# Patient Record
Sex: Female | Born: 1985 | Hispanic: No | Marital: Single | Smoking: Never smoker
Health system: Southern US, Community
[De-identification: ages and names within clinical notes are randomized; demographics above are authoritative.]

## PROBLEM LIST (undated history)

## (undated) DIAGNOSIS — I8290 Acute embolism and thrombosis of unspecified vein: Secondary | ICD-10-CM

## (undated) DIAGNOSIS — F419 Anxiety disorder, unspecified: Secondary | ICD-10-CM

---

## 2019-06-02 ENCOUNTER — Other Ambulatory Visit: Payer: Self-pay | Admitting: *Deleted

## 2019-06-02 DIAGNOSIS — Z20822 Contact with and (suspected) exposure to covid-19: Secondary | ICD-10-CM

## 2019-06-04 LAB — NOVEL CORONAVIRUS, NAA: SARS-CoV-2, NAA: NOT DETECTED

## 2020-01-29 DIAGNOSIS — R002 Palpitations: Secondary | ICD-10-CM | POA: Diagnosis not present

## 2020-01-29 DIAGNOSIS — F418 Other specified anxiety disorders: Secondary | ICD-10-CM | POA: Diagnosis not present

## 2020-02-01 DIAGNOSIS — M9903 Segmental and somatic dysfunction of lumbar region: Secondary | ICD-10-CM | POA: Diagnosis not present

## 2020-02-01 DIAGNOSIS — M5136 Other intervertebral disc degeneration, lumbar region: Secondary | ICD-10-CM | POA: Diagnosis not present

## 2020-02-01 DIAGNOSIS — M5432 Sciatica, left side: Secondary | ICD-10-CM | POA: Diagnosis not present

## 2020-02-05 DIAGNOSIS — M5432 Sciatica, left side: Secondary | ICD-10-CM | POA: Diagnosis not present

## 2020-02-05 DIAGNOSIS — M9903 Segmental and somatic dysfunction of lumbar region: Secondary | ICD-10-CM | POA: Diagnosis not present

## 2020-02-05 DIAGNOSIS — M5136 Other intervertebral disc degeneration, lumbar region: Secondary | ICD-10-CM | POA: Diagnosis not present

## 2020-02-07 DIAGNOSIS — M9903 Segmental and somatic dysfunction of lumbar region: Secondary | ICD-10-CM | POA: Diagnosis not present

## 2020-02-07 DIAGNOSIS — M5136 Other intervertebral disc degeneration, lumbar region: Secondary | ICD-10-CM | POA: Diagnosis not present

## 2020-02-07 DIAGNOSIS — M5432 Sciatica, left side: Secondary | ICD-10-CM | POA: Diagnosis not present

## 2020-02-12 DIAGNOSIS — M5432 Sciatica, left side: Secondary | ICD-10-CM | POA: Diagnosis not present

## 2020-02-12 DIAGNOSIS — M5136 Other intervertebral disc degeneration, lumbar region: Secondary | ICD-10-CM | POA: Diagnosis not present

## 2020-02-12 DIAGNOSIS — M9903 Segmental and somatic dysfunction of lumbar region: Secondary | ICD-10-CM | POA: Diagnosis not present

## 2020-02-14 DIAGNOSIS — M5432 Sciatica, left side: Secondary | ICD-10-CM | POA: Diagnosis not present

## 2020-02-14 DIAGNOSIS — M9903 Segmental and somatic dysfunction of lumbar region: Secondary | ICD-10-CM | POA: Diagnosis not present

## 2020-02-14 DIAGNOSIS — M5136 Other intervertebral disc degeneration, lumbar region: Secondary | ICD-10-CM | POA: Diagnosis not present

## 2020-02-20 DIAGNOSIS — M5432 Sciatica, left side: Secondary | ICD-10-CM | POA: Diagnosis not present

## 2020-02-20 DIAGNOSIS — M9903 Segmental and somatic dysfunction of lumbar region: Secondary | ICD-10-CM | POA: Diagnosis not present

## 2020-02-20 DIAGNOSIS — M5136 Other intervertebral disc degeneration, lumbar region: Secondary | ICD-10-CM | POA: Diagnosis not present

## 2020-02-22 DIAGNOSIS — M5136 Other intervertebral disc degeneration, lumbar region: Secondary | ICD-10-CM | POA: Diagnosis not present

## 2020-02-22 DIAGNOSIS — F418 Other specified anxiety disorders: Secondary | ICD-10-CM | POA: Diagnosis not present

## 2020-02-22 DIAGNOSIS — Z13220 Encounter for screening for lipoid disorders: Secondary | ICD-10-CM | POA: Diagnosis not present

## 2020-02-22 DIAGNOSIS — R002 Palpitations: Secondary | ICD-10-CM | POA: Diagnosis not present

## 2020-02-22 DIAGNOSIS — M9903 Segmental and somatic dysfunction of lumbar region: Secondary | ICD-10-CM | POA: Diagnosis not present

## 2020-02-22 DIAGNOSIS — M5432 Sciatica, left side: Secondary | ICD-10-CM | POA: Diagnosis not present

## 2020-02-22 DIAGNOSIS — Z0001 Encounter for general adult medical examination with abnormal findings: Secondary | ICD-10-CM | POA: Diagnosis not present

## 2020-02-26 DIAGNOSIS — M5136 Other intervertebral disc degeneration, lumbar region: Secondary | ICD-10-CM | POA: Diagnosis not present

## 2020-02-26 DIAGNOSIS — M5432 Sciatica, left side: Secondary | ICD-10-CM | POA: Diagnosis not present

## 2020-02-26 DIAGNOSIS — M9903 Segmental and somatic dysfunction of lumbar region: Secondary | ICD-10-CM | POA: Diagnosis not present

## 2020-02-28 DIAGNOSIS — M5136 Other intervertebral disc degeneration, lumbar region: Secondary | ICD-10-CM | POA: Diagnosis not present

## 2020-02-28 DIAGNOSIS — M9903 Segmental and somatic dysfunction of lumbar region: Secondary | ICD-10-CM | POA: Diagnosis not present

## 2020-02-28 DIAGNOSIS — M5432 Sciatica, left side: Secondary | ICD-10-CM | POA: Diagnosis not present

## 2020-03-04 DIAGNOSIS — M5432 Sciatica, left side: Secondary | ICD-10-CM | POA: Diagnosis not present

## 2020-03-04 DIAGNOSIS — M9903 Segmental and somatic dysfunction of lumbar region: Secondary | ICD-10-CM | POA: Diagnosis not present

## 2020-03-04 DIAGNOSIS — M5136 Other intervertebral disc degeneration, lumbar region: Secondary | ICD-10-CM | POA: Diagnosis not present

## 2020-03-06 DIAGNOSIS — M5136 Other intervertebral disc degeneration, lumbar region: Secondary | ICD-10-CM | POA: Diagnosis not present

## 2020-03-06 DIAGNOSIS — M5432 Sciatica, left side: Secondary | ICD-10-CM | POA: Diagnosis not present

## 2020-03-06 DIAGNOSIS — M9903 Segmental and somatic dysfunction of lumbar region: Secondary | ICD-10-CM | POA: Diagnosis not present

## 2020-03-07 DIAGNOSIS — Z3202 Encounter for pregnancy test, result negative: Secondary | ICD-10-CM | POA: Diagnosis not present

## 2020-03-07 DIAGNOSIS — Z30011 Encounter for initial prescription of contraceptive pills: Secondary | ICD-10-CM | POA: Diagnosis not present

## 2020-03-13 DIAGNOSIS — M9903 Segmental and somatic dysfunction of lumbar region: Secondary | ICD-10-CM | POA: Diagnosis not present

## 2020-03-13 DIAGNOSIS — M5432 Sciatica, left side: Secondary | ICD-10-CM | POA: Diagnosis not present

## 2020-03-13 DIAGNOSIS — M5136 Other intervertebral disc degeneration, lumbar region: Secondary | ICD-10-CM | POA: Diagnosis not present

## 2020-03-18 DIAGNOSIS — M5432 Sciatica, left side: Secondary | ICD-10-CM | POA: Diagnosis not present

## 2020-03-18 DIAGNOSIS — M9903 Segmental and somatic dysfunction of lumbar region: Secondary | ICD-10-CM | POA: Diagnosis not present

## 2020-03-18 DIAGNOSIS — M5136 Other intervertebral disc degeneration, lumbar region: Secondary | ICD-10-CM | POA: Diagnosis not present

## 2020-03-28 DIAGNOSIS — M5136 Other intervertebral disc degeneration, lumbar region: Secondary | ICD-10-CM | POA: Diagnosis not present

## 2020-03-28 DIAGNOSIS — M5432 Sciatica, left side: Secondary | ICD-10-CM | POA: Diagnosis not present

## 2020-03-28 DIAGNOSIS — M9903 Segmental and somatic dysfunction of lumbar region: Secondary | ICD-10-CM | POA: Diagnosis not present

## 2020-04-10 DIAGNOSIS — J029 Acute pharyngitis, unspecified: Secondary | ICD-10-CM | POA: Diagnosis not present

## 2020-04-17 DIAGNOSIS — J029 Acute pharyngitis, unspecified: Secondary | ICD-10-CM | POA: Diagnosis not present

## 2020-04-17 DIAGNOSIS — M5432 Sciatica, left side: Secondary | ICD-10-CM | POA: Diagnosis not present

## 2020-04-17 DIAGNOSIS — M9903 Segmental and somatic dysfunction of lumbar region: Secondary | ICD-10-CM | POA: Diagnosis not present

## 2020-04-17 DIAGNOSIS — M5136 Other intervertebral disc degeneration, lumbar region: Secondary | ICD-10-CM | POA: Diagnosis not present

## 2020-04-24 DIAGNOSIS — R634 Abnormal weight loss: Secondary | ICD-10-CM | POA: Diagnosis not present

## 2020-04-24 DIAGNOSIS — R109 Unspecified abdominal pain: Secondary | ICD-10-CM | POA: Diagnosis not present

## 2020-04-24 DIAGNOSIS — K219 Gastro-esophageal reflux disease without esophagitis: Secondary | ICD-10-CM | POA: Diagnosis not present

## 2020-04-24 DIAGNOSIS — R1111 Vomiting without nausea: Secondary | ICD-10-CM | POA: Diagnosis not present

## 2020-05-08 DIAGNOSIS — K219 Gastro-esophageal reflux disease without esophagitis: Secondary | ICD-10-CM | POA: Diagnosis not present

## 2020-05-08 DIAGNOSIS — F418 Other specified anxiety disorders: Secondary | ICD-10-CM | POA: Diagnosis not present

## 2020-06-19 DIAGNOSIS — M9903 Segmental and somatic dysfunction of lumbar region: Secondary | ICD-10-CM | POA: Diagnosis not present

## 2020-06-19 DIAGNOSIS — M5136 Other intervertebral disc degeneration, lumbar region: Secondary | ICD-10-CM | POA: Diagnosis not present

## 2020-06-19 DIAGNOSIS — M5432 Sciatica, left side: Secondary | ICD-10-CM | POA: Diagnosis not present

## 2020-06-26 DIAGNOSIS — M79672 Pain in left foot: Secondary | ICD-10-CM | POA: Diagnosis not present

## 2020-07-03 DIAGNOSIS — M79675 Pain in left toe(s): Secondary | ICD-10-CM | POA: Diagnosis not present

## 2020-07-11 DIAGNOSIS — M5432 Sciatica, left side: Secondary | ICD-10-CM | POA: Diagnosis not present

## 2020-07-11 DIAGNOSIS — M9903 Segmental and somatic dysfunction of lumbar region: Secondary | ICD-10-CM | POA: Diagnosis not present

## 2020-07-11 DIAGNOSIS — M5136 Other intervertebral disc degeneration, lumbar region: Secondary | ICD-10-CM | POA: Diagnosis not present

## 2020-07-19 ENCOUNTER — Other Ambulatory Visit: Payer: Self-pay | Admitting: Podiatry

## 2020-07-19 ENCOUNTER — Other Ambulatory Visit: Payer: Self-pay

## 2020-07-19 ENCOUNTER — Ambulatory Visit (INDEPENDENT_AMBULATORY_CARE_PROVIDER_SITE_OTHER): Payer: BLUE CROSS/BLUE SHIELD | Admitting: Podiatry

## 2020-07-19 ENCOUNTER — Encounter: Payer: Self-pay | Admitting: Podiatry

## 2020-07-19 DIAGNOSIS — Z79899 Other long term (current) drug therapy: Secondary | ICD-10-CM | POA: Diagnosis not present

## 2020-07-19 NOTE — Progress Notes (Signed)
  Subjective:  Patient ID: Leanord Hawking, female    DOB: 11-27-1985,  MRN: [id number]  Chief Complaint  Patient presents with  . Nail Problem    left hallux nail. PT stated that it is coming off     34 y.o. female presents with the above complaint.  Patient presents with complaint of left hallux onychomycosis.  Patient states is been coming a little loose because of fungus underneath it.  She has not tried any medication she has not seen anyone else prior to seeing me.  She would like to discuss treatment options for this.  She denies any other acute complaint she has not had any trauma to the area.  She would like to discuss other treatment options that are available to her.   Review of Systems: Negative except as noted in the HPI. Denies N/V/F/Ch.  No past medical history on file.  Current Outpatient Medications:  .  PORTIA-28 0.15-30 MG-MCG tablet, Take by mouth., Disp: , Rfl:  .  sertraline (ZOLOFT) 50 MG tablet, Take 75 mg by mouth daily., Disp: , Rfl:   Social History   Tobacco Use  Smoking Status Not on file    Not on File Objective:  There were no vitals filed for this visit. There is no height or weight on file to calculate BMI. Constitutional Well developed. Well nourished.  Vascular Dorsalis pedis pulses palpable bilaterally. Posterior tibial pulses palpable bilaterally. Capillary refill normal to all digits.  No cyanosis or clubbing noted. Pedal hair growth normal.  Neurologic Normal speech. Oriented to person, place, and time. Epicritic sensation to light touch grossly present bilaterally.  Dermatologic  thickened discolored dystrophic nail noted to the left hallux.  Mild pain on palpation.  Orthopedic: Normal joint ROM without pain or crepitus bilaterally. No visible deformities. No bony tenderness.   Radiographs: None Assessment:   1. Long-term use of high-risk medication    Plan:  Patient was evaluated and treated and all questions  answered.  Left hallux onychomycosis -Educated the patient on the etiology of onychomycosis and various treatment options associated with improving the fungal load.  I explained to the patient that there is 3 treatment options available to treat the onychomycosis including topical, p.o., laser treatment.  Patient elected to undergo p.o. options with Lamisil/terbinafine therapy.  In order for me to start the medication therapy, I explained to the patient the importance of evaluating the liver and obtaining the liver function test.  Once the liver function test comes back normal I will start him on 76-month course of Lamisil therapy.  Patient understood all risk and would like to proceed with Lamisil therapy.  I have asked the patient to immediately stop the Lamisil therapy if she has any reactions to it and call the office or go to the emergency room right away.  Patient states understanding   No follow-ups on file.

## 2020-07-20 LAB — HEPATIC FUNCTION PANEL
ALT: 10 IU/L (ref 0–32)
AST: 11 IU/L (ref 0–40)
Albumin: 3.8 g/dL (ref 3.8–4.8)
Alkaline Phosphatase: 49 IU/L (ref 44–121)
Bilirubin Total: <0.2 mg/dL (ref 0.0–1.2)
Bilirubin, Direct: <0.1 mg/dL (ref 0.00–0.40)
Total Protein: 6.2 g/dL (ref 6.0–8.5)

## 2020-07-26 ENCOUNTER — Telehealth: Payer: Self-pay | Admitting: Podiatry

## 2020-07-26 NOTE — Telephone Encounter (Signed)
Pt was calling about lab results and statrting new fungus medication. Please call patient

## 2020-07-26 NOTE — Telephone Encounter (Signed)
There is no pharmacy listed. Would you be able to call them and add it. I can send it over then. That's why I couldn't send it last week.  Thank you

## 2020-07-26 NOTE — Telephone Encounter (Signed)
Pharmacy added.

## 2020-07-29 ENCOUNTER — Telehealth: Payer: Self-pay | Admitting: Podiatry

## 2020-07-29 MED ORDER — TERBINAFINE HCL 250 MG PO TABS: 250.0000 mg | ORAL_TABLET | Freq: Every day | ORAL | 0 refills | Status: DC

## 2020-07-29 NOTE — Telephone Encounter (Signed)
Patient called in stating the prescription for nail fungus wasn't called into pharmacy, pharmacy info has been updated, please advise

## 2020-07-29 NOTE — Telephone Encounter (Signed)
done

## 2020-07-29 NOTE — Addendum Note (Signed)
Addended by: Nicholes Rough on: 07/29/2020 09:58 AM   Modules accepted: Orders

## 2020-08-05 DIAGNOSIS — M5136 Other intervertebral disc degeneration, lumbar region: Secondary | ICD-10-CM | POA: Diagnosis not present

## 2020-08-05 DIAGNOSIS — M9903 Segmental and somatic dysfunction of lumbar region: Secondary | ICD-10-CM | POA: Diagnosis not present

## 2020-08-05 DIAGNOSIS — M5432 Sciatica, left side: Secondary | ICD-10-CM | POA: Diagnosis not present

## 2020-08-14 ENCOUNTER — Telehealth: Payer: Self-pay | Admitting: Podiatry

## 2020-08-14 NOTE — Telephone Encounter (Signed)
Pt is having severe headaches itching and she wanted to know if it was a side effect from the Lamisil. Please call patient

## 2020-08-20 MED ORDER — ITRACONAZOLE 100 MG PO CAPS: 100.0000 mg | ORAL_CAPSULE | Freq: Every day | ORAL | 0 refills | Status: DC

## 2020-08-20 NOTE — Addendum Note (Signed)
Addended by: Nicholes Rough on: 08/20/2020 08:05 AM   Modules accepted: Orders

## 2020-08-20 NOTE — Telephone Encounter (Signed)
Done. I spoke to the patient we will change it over to itraconazole

## 2020-08-20 NOTE — Telephone Encounter (Signed)
Thank you :)

## 2020-08-23 DIAGNOSIS — L739 Follicular disorder, unspecified: Secondary | ICD-10-CM | POA: Diagnosis not present

## 2021-03-13 DIAGNOSIS — F418 Other specified anxiety disorders: Secondary | ICD-10-CM | POA: Diagnosis not present

## 2021-03-13 DIAGNOSIS — Z30011 Encounter for initial prescription of contraceptive pills: Secondary | ICD-10-CM | POA: Diagnosis not present

## 2021-04-03 DIAGNOSIS — Z13220 Encounter for screening for lipoid disorders: Secondary | ICD-10-CM | POA: Diagnosis not present

## 2021-04-03 DIAGNOSIS — Z124 Encounter for screening for malignant neoplasm of cervix: Secondary | ICD-10-CM | POA: Diagnosis not present

## 2021-04-03 DIAGNOSIS — Z0000 Encounter for general adult medical examination without abnormal findings: Secondary | ICD-10-CM | POA: Diagnosis not present

## 2021-05-31 ENCOUNTER — Emergency Department (HOSPITAL_COMMUNITY): Payer: BC Managed Care – PPO

## 2021-05-31 ENCOUNTER — Other Ambulatory Visit: Payer: Self-pay

## 2021-05-31 ENCOUNTER — Emergency Department (HOSPITAL_COMMUNITY)
Admission: EM | Admit: 2021-05-31 | Discharge: 2021-05-31 | Disposition: A | Discharge status: Home / Self Care | Payer: BC Managed Care – PPO | Attending: Emergency Medicine | Admitting: Emergency Medicine

## 2021-05-31 DIAGNOSIS — H5460 Unqualified visual loss, one eye, unspecified: Secondary | ICD-10-CM | POA: Diagnosis not present

## 2021-05-31 DIAGNOSIS — H5462 Unqualified visual loss, left eye, normal vision right eye: Secondary | ICD-10-CM | POA: Diagnosis not present

## 2021-05-31 DIAGNOSIS — H53122 Transient visual loss, left eye: Secondary | ICD-10-CM | POA: Diagnosis not present

## 2021-05-31 DIAGNOSIS — H547 Unspecified visual loss: Secondary | ICD-10-CM

## 2021-05-31 LAB — CBC WITH DIFFERENTIAL/PLATELET
Abs Immature Granulocytes: 0.06 10*3/uL (ref 0.00–0.07)
Basophils Absolute: 0 10*3/uL (ref 0.0–0.1)
Basophils Relative: 0 %
Eosinophils Absolute: 0.2 10*3/uL (ref 0.0–0.5)
Eosinophils Relative: 2 %
HCT: 43.8 % (ref 36.0–46.0)
Hemoglobin: 14.5 g/dL (ref 12.0–15.0)
Immature Granulocytes: 1 %
Lymphocytes Relative: 19 %
Lymphs Abs: 1.9 10*3/uL (ref 0.7–4.0)
MCH: 30.5 pg (ref 26.0–34.0)
MCHC: 33.1 g/dL (ref 30.0–36.0)
MCV: 92.2 fL (ref 80.0–100.0)
Monocytes Absolute: 0.8 10*3/uL (ref 0.1–1.0)
Monocytes Relative: 8 %
Neutro Abs: 7.2 10*3/uL (ref 1.7–7.7)
Neutrophils Relative %: 70 %
Platelets: 314 10*3/uL (ref 150–400)
RBC: 4.75 MIL/uL (ref 3.87–5.11)
RDW: 13.3 % (ref 11.5–15.5)
WBC: 10.2 10*3/uL (ref 4.0–10.5)
nRBC: 0 % (ref 0.0–0.2)

## 2021-05-31 LAB — BASIC METABOLIC PANEL
Anion gap: 7 (ref 5–15)
BUN: 13 mg/dL (ref 6–20)
CO2: 23 mmol/L (ref 22–32)
Calcium: 9.3 mg/dL (ref 8.9–10.3)
Chloride: 112 mmol/L — ABNORMAL HIGH (ref 98–111)
Creatinine, Ser: 0.72 mg/dL (ref 0.44–1.00)
GFR, Estimated: >60 mL/min (ref >60)
Glucose, Bld: 90 mg/dL (ref 70–99)
Potassium: 4.4 mmol/L (ref 3.5–5.1)
Sodium: 142 mmol/L (ref 135–145)

## 2021-05-31 LAB — HCG, QUANTITATIVE, PREGNANCY: hCG, Beta Chain, Quant, S: <1 m[IU]/mL (ref <5)

## 2021-05-31 IMAGING — MR MR ORBITS WO/W CM
7 series · 42 of 48 positions shown · IV contrast (gadavist)
Comparison: None.

CLINICAL DATA: Vision loss, monocular transient L eye vision loss

EXAM:
MRI HEAD AND ORBITS WITHOUT AND WITH CONTRAST
TECHNIQUE: Multiplanar, multiecho pulse sequences of the brain and surrounding
structures were obtained without and with intravenous contrast.
Multiplanar, multiecho pulse sequences of the orbits and surrounding
structures were obtained including fat saturation techniques, before
and after intravenous contrast administration.
CONTRAST:  10mL GADAVIST GADOBUTROL 1 MMOL/ML IV SOLN

[Series 2: T1 · sagittal · 5.0mm · 0.75mm/px · 8 of 27 slices shown (1 of 3)]
[im 1/27]
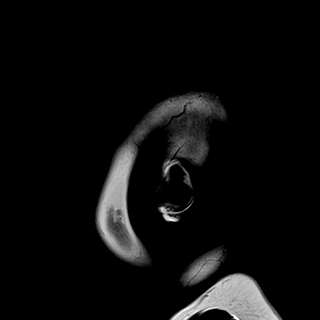
[im 4/27]
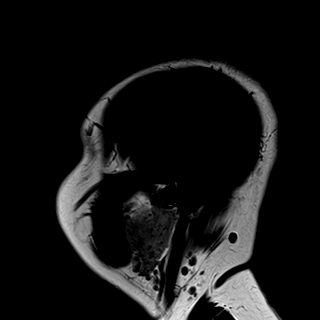
[im 8/27]
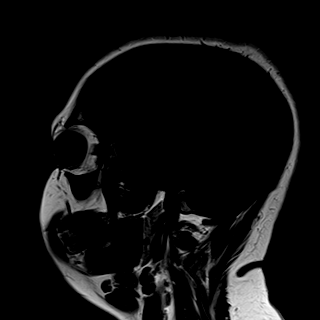
[im 12/27]
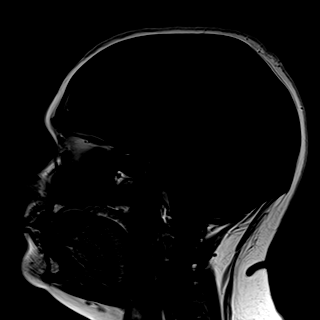
[im 15/27]
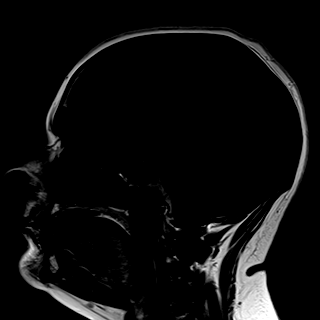
[im 19/27]
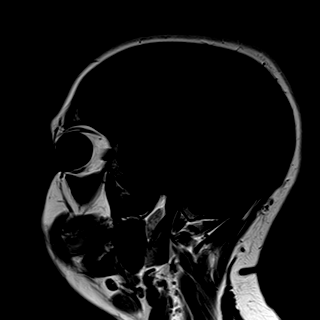
[im 23/27]
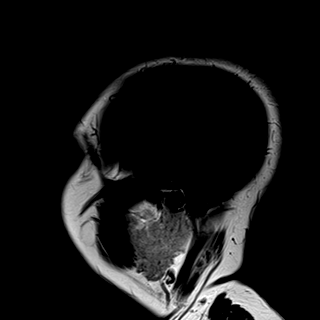
[im 27/27]
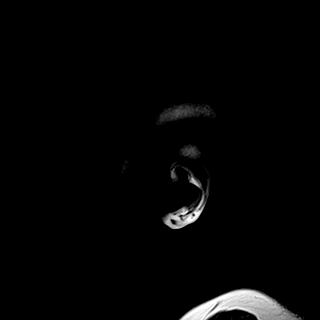

[Series 3: T2 fat-sat · axial · 3.0mm · 0.47mm/px · z∈[-38,+13]mm · 5 of 17 slices shown (1 of 2)]
[im 1/17]
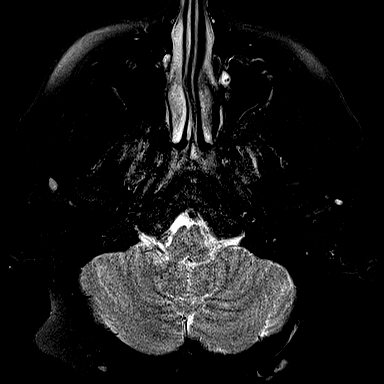
[im 5/17]
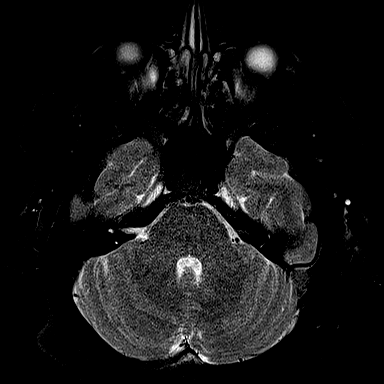
[im 9/17]
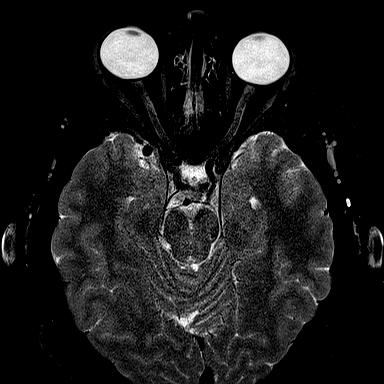
[im 13/17]
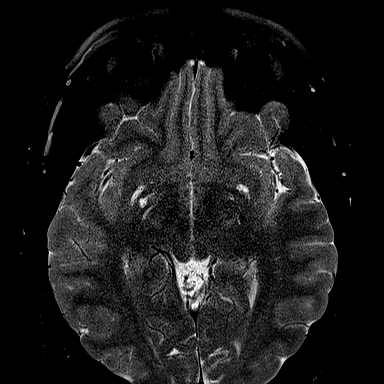
[im 17/17]
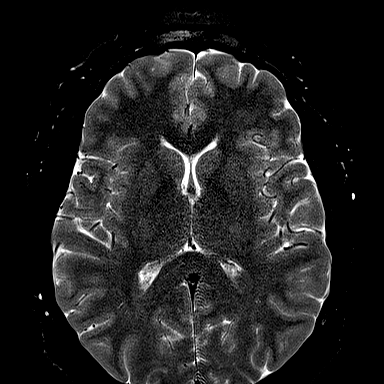

[Series 4: T1 · axial · 3.0mm · 0.56mm/px · z∈[-38,+13]mm · 4 of 17 slices shown (2 of 3)]
[im 1/17]
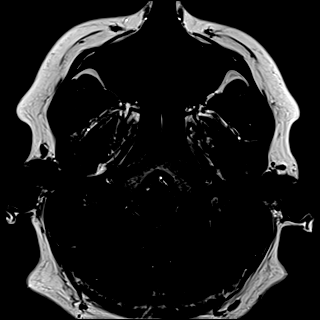
[im 6/17]
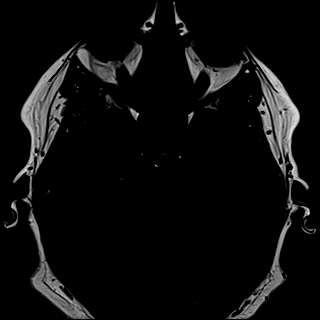
[im 11/17]
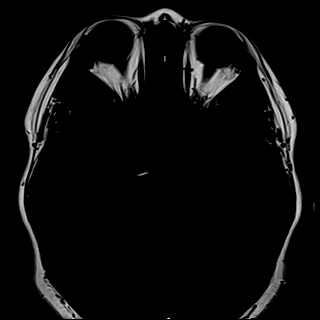
[im 17/17]
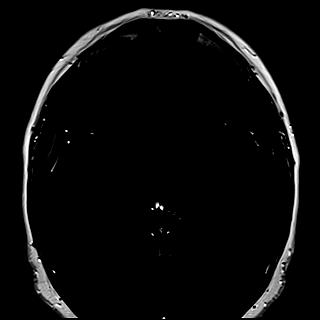

[Series 5: T2 fat-sat · coronal · 3.0mm · 0.47mm/px · 9 of 35 slices shown (2 of 2)]
[im 1/35]
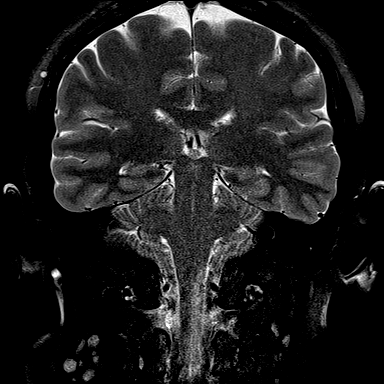
[im 5/35]
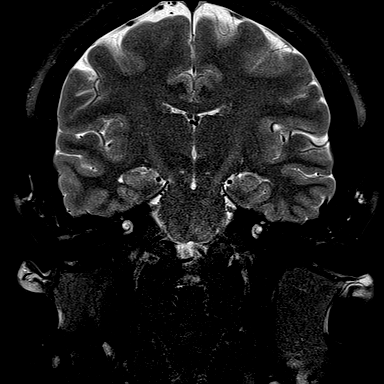
[im 9/35]
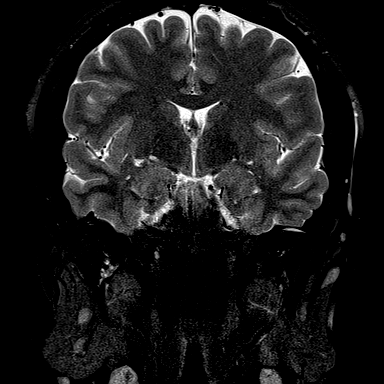
[im 13/35]
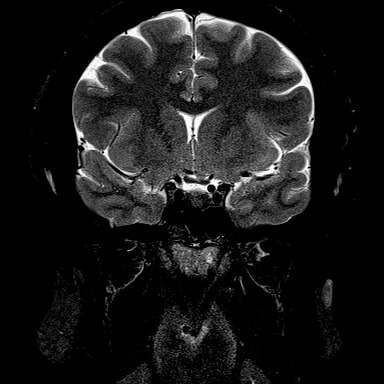
[im 18/35]
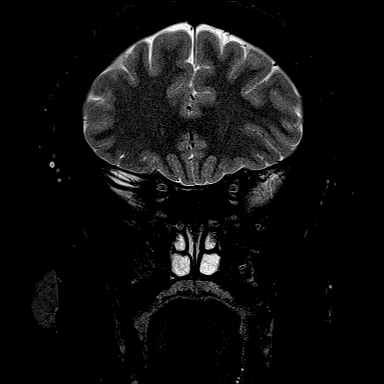
[im 22/35]
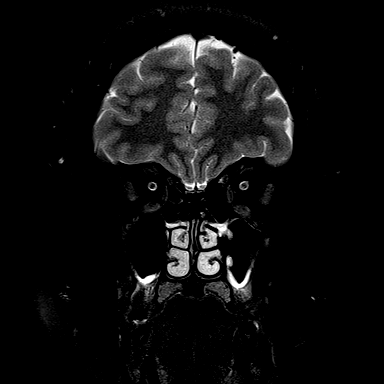
[im 26/35]
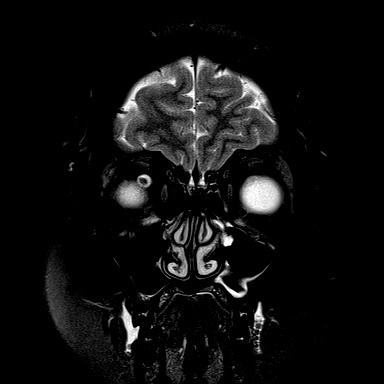
[im 30/35]
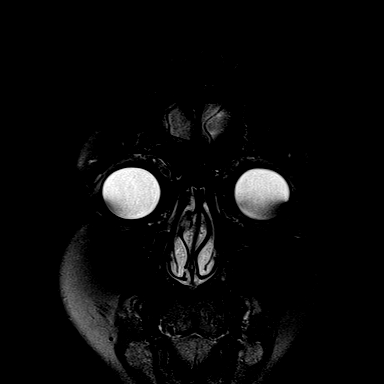
[im 35/35]
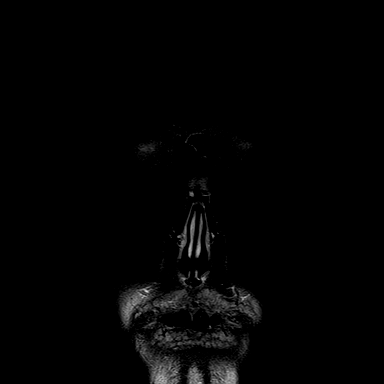

[Series 6: T1 · coronal · 3.0mm · 0.56mm/px · 8 of 35 slices shown (3 of 3)]
[im 1/35]
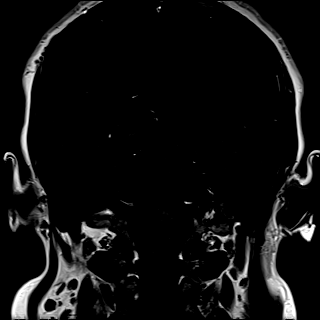
[im 5/35]
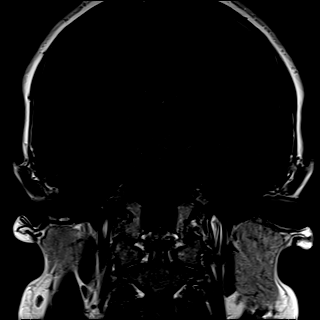
[im 9/35]
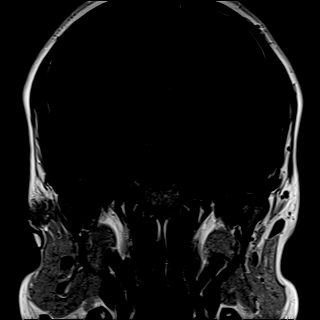
[im 13/35]
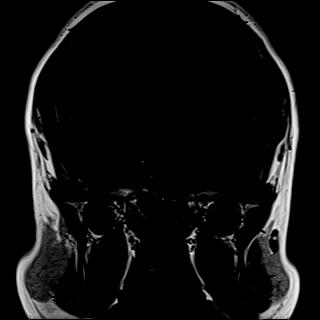
[im 22/35]
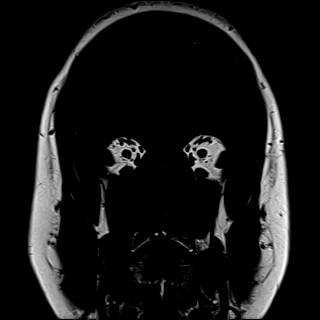
[im 26/35]
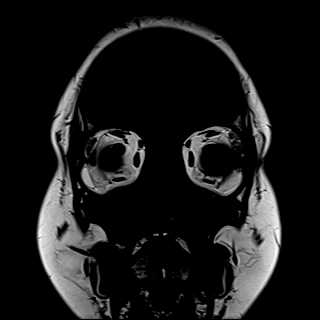
[im 30/35]
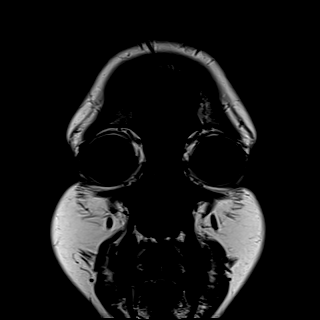
[im 35/35]
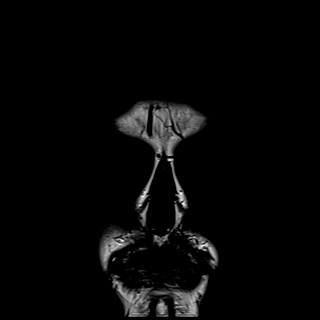

[Series 7: T1 fat-sat post-contrast · axial · 3.0mm · 0.56mm/px · z∈[-38,+13]mm · 4 of 17 slices shown (1 of 2)]
[im 1/17]
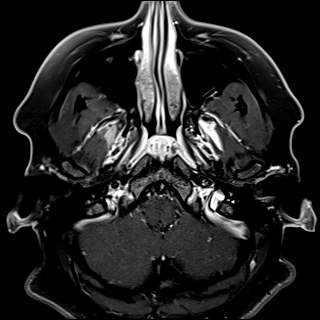
[im 6/17]
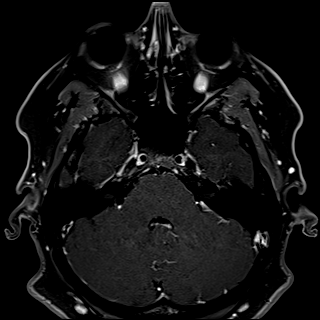
[im 11/17]
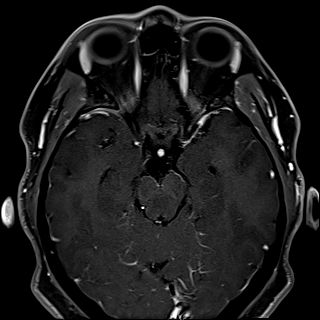
[im 17/17]
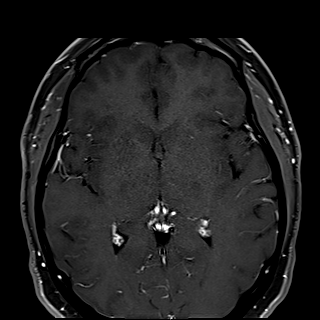

[Series 8: T1 fat-sat post-contrast · coronal · 3.0mm · 0.70mm/px · 4 of 35 slices shown (2 of 2)]
[im 1/35]
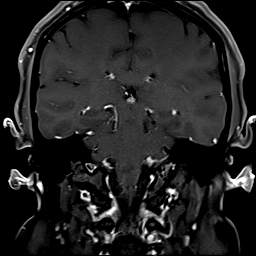
[im 5/35]
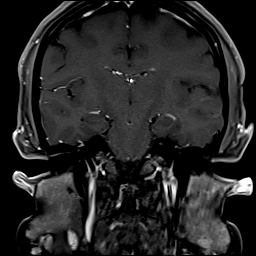
[im 9/35]
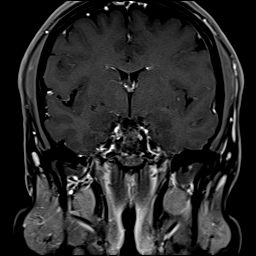
[im 13/35]
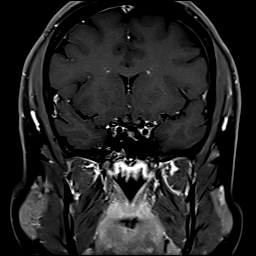

[42 of 48 positions shown; findings below may reference images not displayed]

FINDINGS: MRI HEAD FINDINGS

Brain: No acute infarction, hemorrhage, hydrocephalus, extra-axial
collection or mass lesion. No abnormal enhancement. Unremarkable
pituitary/sella. Normal position of the cerebellar tonsils. Normal
white matter.

Vascular: Major arterial flow voids are maintained at the skull
base.

Skull and upper cervical spine: Normal marrow signal.

Other: No sizable mastoid effusion.

MRI ORBITS FINDINGS

Orbits: No traumatic or inflammatory finding. Globes, optic nerves,
orbital fat, extraocular muscles, vascular structures, and lacrimal
glands are normal.

Visualized sinuses: Mild mucosal thickening of scattered ethmoid air
cells and the left maxillary sinus. No air-fluid levels.

Soft tissues: Nonspecific mildly prominent lymph nodes within the
partially imaged upper neck. Otherwise, unremarkable soft tissues.
IMPRESSION: No evidence of acute intracranial or orbital abnormality.

## 2021-05-31 IMAGING — MR MR HEAD WO/W CM
13 series · 48 of 48 positions shown · IV contrast (gadavist)
Comparison: None.

CLINICAL DATA: Vision loss, monocular transient L eye vision loss

EXAM:
MRI HEAD AND ORBITS WITHOUT AND WITH CONTRAST
TECHNIQUE: Multiplanar, multiecho pulse sequences of the brain and surrounding
structures were obtained without and with intravenous contrast.
Multiplanar, multiecho pulse sequences of the orbits and surrounding
structures were obtained including fat saturation techniques, before
and after intravenous contrast administration.
CONTRAST:  10mL GADAVIST GADOBUTROL 1 MMOL/ML IV SOLN

[Series 5: DWI · axial · 3.0mm · 1.36mm/px · z∈[-58,+96]mm · 6 of 108 slices shown (1 of 2)]
[im 1/108]
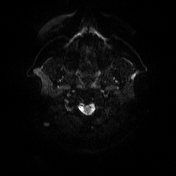
[im 22/108]
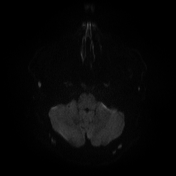
[im 43/108]
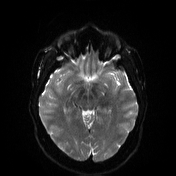
[im 65/108]
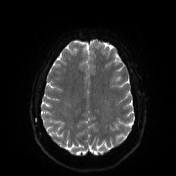
[im 86/108]
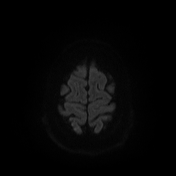
[im 108/108]
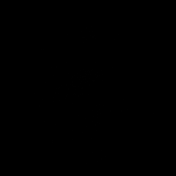

[Series 6: DWI · axial · 3.0mm · 1.36mm/px · z∈[-58,+93]mm · 3 of 53 slices shown (2 of 2)]
[im 1/53]
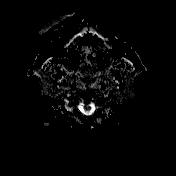
[im 27/53]
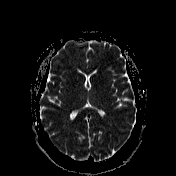
[im 53/53]
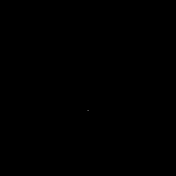

[Series 8: T1 · sagittal · 5.0mm · 0.75mm/px · 2 of 27 slices shown (1 of 2)]
[im 1/27]
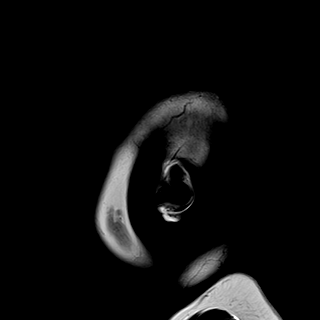
[im 27/27]
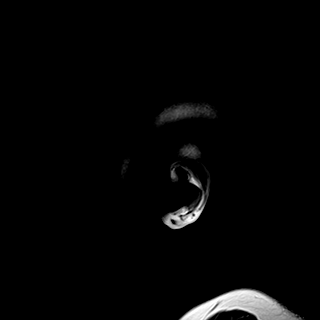

[Series 9: T2 · axial · 5.0mm · 0.62mm/px · z∈[-66,+91]mm · 2 of 26 slices shown]
[im 1/26]
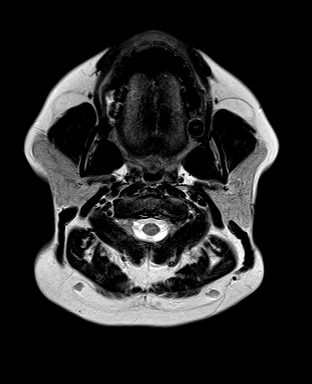
[im 26/26]
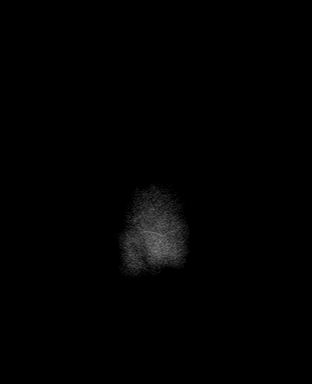

[Series 10: swi_images · axial · 3.0mm · 0.75mm/px · z∈[-68,+92]mm · 3 of 56 slices shown]
[im 1/56]
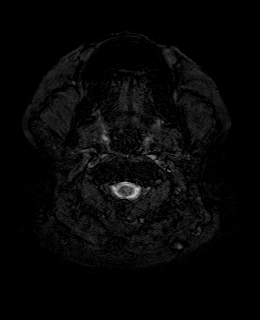
[im 28/56]
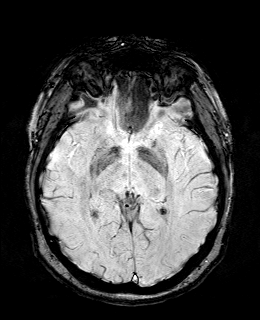
[im 56/56]
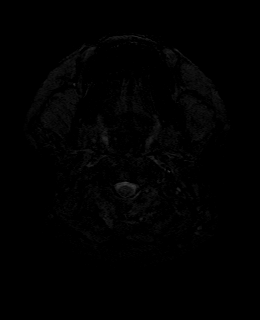

[Series 12: FLAIR · axial · 3.0mm · 0.75mm/px · z∈[-62,+87]mm · 3 of 52 slices shown]
[im 1/52]
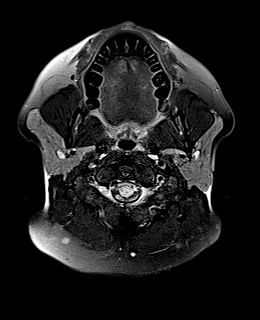
[im 26/52]
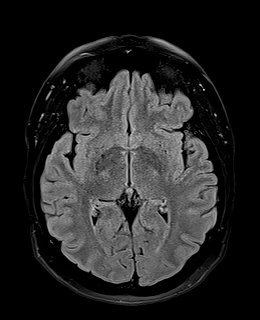
[im 52/52]
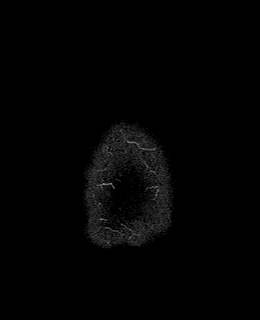

[Series 13: T1 · axial · 1.0mm · 0.94mm/px · z∈[-61,+93]mm · 9 of 160 slices shown (2 of 2)]
[im 1/160]
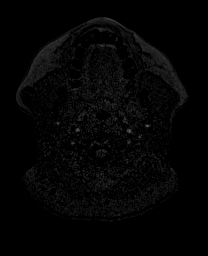
[im 20/160]
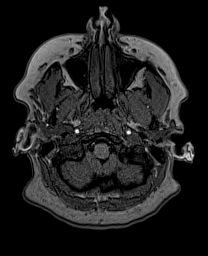
[im 40/160]
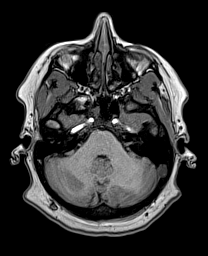
[im 60/160]
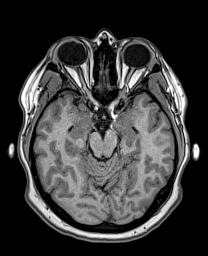
[im 80/160]
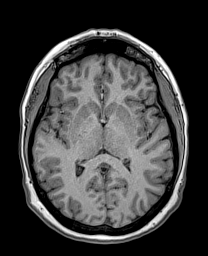
[im 100/160]
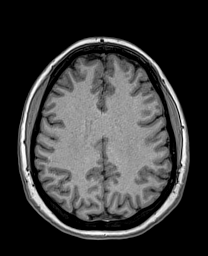
[im 120/160]
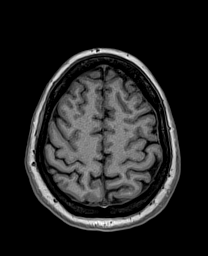
[im 140/160]
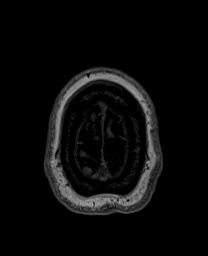
[im 160/160]
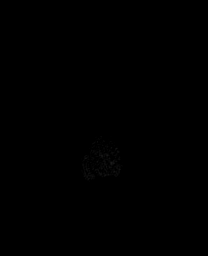

[Series 14: cor dwi_tracew · coronal · 5.0mm · 1.53mm/px · 4 of 60 slices shown]
[im 1/60]
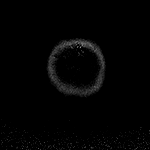
[im 20/60]
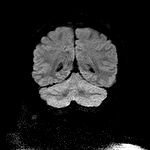
[im 40/60]
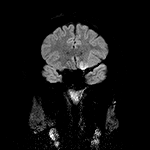
[im 60/60]
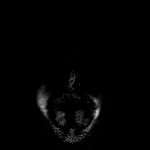

[Series 15: cor dwi_adc · coronal · 5.0mm · 1.53mm/px · 2 of 29 slices shown]
[im 1/29]
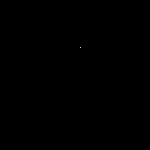
[im 29/29]
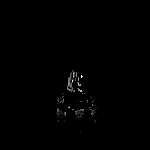

[Series 16: T2 post-contrast · coronal · 5.0mm · 0.57mm/px · 2 of 30 slices shown]
[im 1/30]
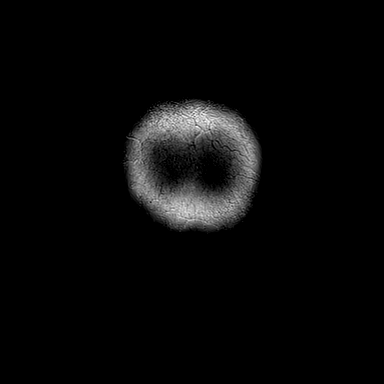
[im 30/30]
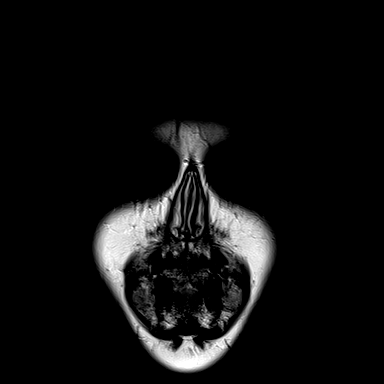

[Series 17: T1 post-contrast · axial · 1.0mm · 0.94mm/px · z∈[-62,+93]mm · 9 of 160 slices shown (1 of 3)]
[im 1/160]
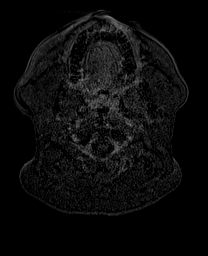
[im 20/160]
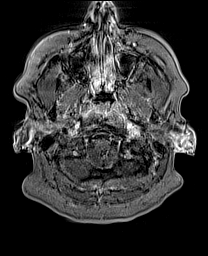
[im 40/160]
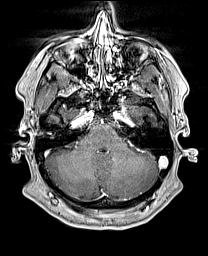
[im 60/160]
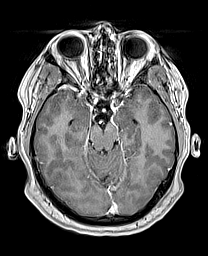
[im 80/160]
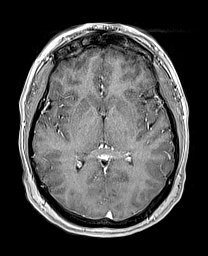
[im 100/160]
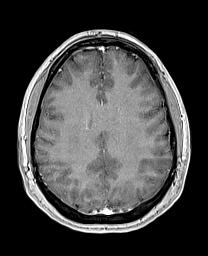
[im 120/160]
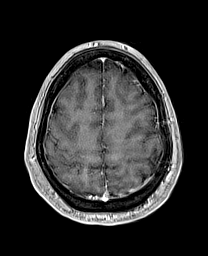
[im 140/160]
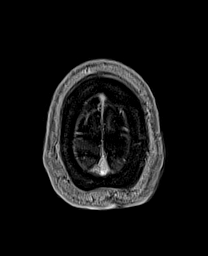
[im 160/160]
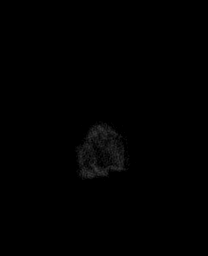

[Series 18: T1 post-contrast · coronal · 5.0mm · 0.43mm/px · 2 of 30 slices shown (2 of 3)]
[im 1/30]
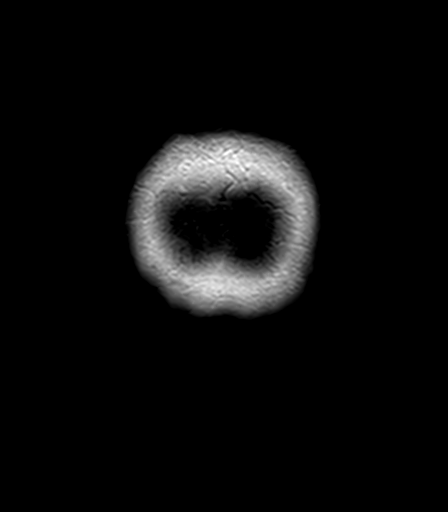
[im 30/30]
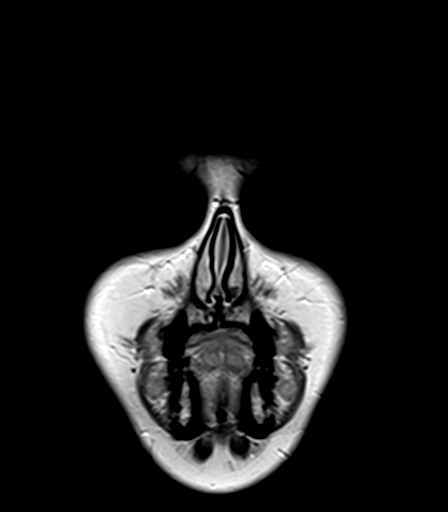

[Series 19: T1 post-contrast · sagittal · 5.0mm · 0.75mm/px · 1 of 24 slices shown (3 of 3)]
[im 1/24]
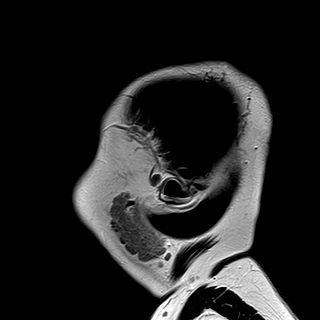

[48 of 48 positions shown; findings below may reference images not displayed]

FINDINGS: MRI HEAD FINDINGS

Brain: No acute infarction, hemorrhage, hydrocephalus, extra-axial
collection or mass lesion. No abnormal enhancement. Unremarkable
pituitary/sella. Normal position of the cerebellar tonsils. Normal
white matter.

Vascular: Major arterial flow voids are maintained at the skull
base.

Skull and upper cervical spine: Normal marrow signal.

Other: No sizable mastoid effusion.

MRI ORBITS FINDINGS

Orbits: No traumatic or inflammatory finding. Globes, optic nerves,
orbital fat, extraocular muscles, vascular structures, and lacrimal
glands are normal.

Visualized sinuses: Mild mucosal thickening of scattered ethmoid air
cells and the left maxillary sinus. No air-fluid levels.

Soft tissues: Nonspecific mildly prominent lymph nodes within the
partially imaged upper neck. Otherwise, unremarkable soft tissues.
IMPRESSION: No evidence of acute intracranial or orbital abnormality.

## 2021-05-31 MED ORDER — GADOBUTROL 1 MMOL/ML IV SOLN
10.0000 mL | Freq: Once | INTRAVENOUS | Status: AC | PRN
  Administered 2021-05-31: 10 mL via INTRAVENOUS

## 2021-05-31 NOTE — ED Provider Notes (Signed)
Care assumed from Hawkins, PA-C at shift change pending MRI.  See her note for full HPI.  Patient is a 35 year old female who presents to the ED due to sudden onset of left central vision loss.  Vision loss lasted roughly 20 to 30 minutes and is completely resolved.  No other neurological symptoms.  No injury to eye.  She wears corrective lenses.  No concurrent headache. Physical Exam  BP 129/80 (BP Location: Left Arm)   Pulse 80   Temp 98.5 F (36.9 C) (Oral)   Resp 18   SpO2 97%   Physical Exam Vitals and nursing note reviewed.  Constitutional:      General: She is not in acute distress.    Appearance: She is not ill-appearing.  HENT:     Head: Normocephalic.  Eyes:     Pupils: Pupils are equal, round, and reactive to light.  Cardiovascular:     Rate and Rhythm: Normal rate and regular rhythm.     Pulses: Normal pulses.     Heart sounds: Normal heart sounds. No murmur heard.   No friction rub. No gallop.  Pulmonary:     Effort: Pulmonary effort is normal.     Breath sounds: Normal breath sounds.  Abdominal:     General: Abdomen is flat. There is no distension.     Palpations: Abdomen is soft.     Tenderness: There is no abdominal tenderness. There is no guarding or rebound.  Musculoskeletal:        General: Normal range of motion.     Cervical back: Neck supple.  Skin:    General: Skin is warm and dry.  Neurological:     General: No focal deficit present.     Mental Status: She is alert.     Comments: Speech is clear, able to follow commands CN III-XII intact Normal strength in upper and lower extremities bilaterally including dorsiflexion and plantar flexion, strong and equal grip strength Sensation grossly intact throughout Moves extremities without ataxia, coordination intact No pronator drift Ambulates without difficulty  Psychiatric:        Mood and Affect: Mood normal.        Behavior: Behavior normal.    ED Course/Procedures     Procedures  MDM   Care assumed from [facility], PA-C at shift change pending MRI.  See her note for full MDM.  35 year old female presents to the ED due to sudden onset of left central vision loss that it occurred for 20 to 30 minutes.  Vision loss has completely resolved.  No concurrent headache.  MRI brain and orbits personally reviewed which are negative for any acute abnormalities.  Discussed case with Dr. Otelia Limes with neurology who recommends outpatient opthalmology follow-up on Monday. Will discuss with Opthalmology to set up follow-up.   Discussed case with Dr. Sherryll Burger with ophthalmology who believes vision loss sounds like a retinal migraine.  Dr. Sherryll Burger will follow-up with patient on Monday for further evaluation. Strict ED precautions discussed with patient. Patient states understanding and agrees to plan. Patient discharged home in no acute distress and stable vitals       Jesusita Oka 05/31/21 1857    Charlynne Pander, MD 05/31/21 [phone]

## 2021-05-31 NOTE — ED Provider Notes (Signed)
Emergency Medicine Provider Triage Evaluation Note  Kayla Butler , a 35 y.o. female  was evaluated in triage.  Pt complains of vision loss.  Review of Systems  Positive: Transient L eye vision loss Negative: Fever, headache, eye pain  Physical Exam  BP (!) 141/86 (BP Location: Left Arm)   Pulse 70   Temp 98.2 F (36.8 C) (Oral)   Resp 18   SpO2 95%  Gen:   Awake, no distress   Resp:  Normal effort  MSK:   Moves extremities without difficulty  Other:  PERRL, EOMI, normal visual field  Medical Decision Making  Medically screening exam initiated at 11:11 AM.  Appropriate orders placed.  Kayla Butler was informed that the remainder of the evaluation will be completed by another provider, this initial triage assessment does not replace that evaluation, and the importance of remaining in the ED until their evaluation is complete.  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  Described squiggly lines and a large black sunspot in her left vision field.  No visual field cut.  She is back to baseline, no medical hx. No hx of MS.  Does wear prescription glasses   Fayrene Helper, PA-C 05/31/21 1113    Melene Plan, DO 05/31/21 [phone]

## 2021-05-31 NOTE — ED Notes (Signed)
Visual acuity Right Eye at 63ft is 20/50. Left eye at 70ft is 20/50. Both eyes at 40ft at 20/40.

## 2021-05-31 NOTE — Discharge Instructions (Signed)
It was a pleasure taking care of you today.  As discussed, your MRIs were normal.  I discussed with both neurology and ophthalmology.  I have included the number of the ophthalmologist.  Please call Monday to schedule an appointment.  The Ophthalmologist will see you on Monday for further evaluation.  He believes your vision loss was likely due to a retinal migraine.  Return to the ER for new or worsening symptoms.

## 2021-05-31 NOTE — ED Notes (Signed)
Patient gone for testing. Unable to update vitals. ?

## 2021-05-31 NOTE — ED Provider Notes (Signed)
[facility]-EMERGENCY DEPT Provider Note   CSN: [id number] Arrival date & time: 05/31/21  1044     History Chief Complaint  Patient presents with   Eye Problem    Kayla Butler is a 35 y.o. female who presents emergency department with chief complaint of vision loss.  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  She states that she had a black spot that she could not see past.  She had vision around the black spot.  She states that this lasted for 20 to 30 minutes and then completely resolved.  She called her mom who then brought her here to the emergency department.  She denies any other abnormal visual changes, difficulty with speech, headache, unilateral weakness or numbness, difficulty with ambulation, disequilibrium or other abnormal neurologic change.  He has never had anything like this before and denies any injury to the eye.  She wears corrective lenses.   Eye Problem     No past medical history on file.  There are no problems to display for this patient.   No past surgical history on file.   OB History   No obstetric history on file.     No family history on file.     Home Medications Prior to Admission medications   Medication Sig Start Date End Date Taking? Authorizing Provider  PORTIA-28 0.15-30 MG-MCG tablet Take 1 tablet by mouth daily. 07/07/20  Yes [provider]  sertraline (ZOLOFT) 100 MG tablet Take 100 mg by mouth daily. 03/13/21  Yes [provider]  itraconazole (SPORANOX) 100 MG capsule Take 1 capsule (100 mg total) by mouth daily. Patient not taking: Reported on 05/31/2021 08/20/20   Candelaria Stagers, DPM  terbinafine (LAMISIL) 250 MG tablet Take 1 tablet (250 mg total) by mouth daily. Patient not taking: Reported on 05/31/2021 07/29/20   Candelaria Stagers, DPM    Allergies    Patient has no known allergies.  Review  of Systems   Review of Systems Ten systems reviewed and are negative for acute change, except as noted in the HPI.   Physical Exam Updated Vital Signs BP 131/76 (BP Location: Left Arm)   Pulse 73   Temp 98.5 F (36.9 C) (Oral)   Resp 18   SpO2 94%   Physical Exam Vitals and nursing note reviewed.  Constitutional:      General: She is not in acute distress.    Appearance: She is well-developed. She is not diaphoretic.  HENT:     Head: Normocephalic and atraumatic.     Right Ear: External ear normal.     Left Ear: External ear normal.     Nose: Nose normal.     Mouth/Throat:     Mouth: Mucous membranes are moist.  Eyes:     General: No scleral icterus.    Conjunctiva/sclera: Conjunctivae normal.     Funduscopic exam:    Right eye: No hemorrhage, exudate or papilledema. Red reflex present.        Left eye: No hemorrhage, exudate or papilledema. Red reflex present. Cardiovascular:     Rate and Rhythm: Normal rate and regular rhythm.     Heart sounds: Normal heart sounds. No murmur heard.   No friction rub. No gallop.  Pulmonary:     Effort: Pulmonary effort is normal. No respiratory distress.     Breath  sounds: Normal breath sounds.  Abdominal:     General: Bowel sounds are normal. There is no distension.     Palpations: Abdomen is soft. There is no mass.     Tenderness: There is no abdominal tenderness. There is no guarding.  Musculoskeletal:     Cervical back: Normal range of motion.  Skin:    General: Skin is warm and dry.  Neurological:     Mental Status: She is alert and oriented to person, place, and time.     Comments: Speech is clear and goal oriented, follows commands Major Cranial nerves without deficit, no facial droop Normal strength in upper and lower extremities bilaterally including dorsiflexion and plantar flexion, strong and equal grip strength Sensation normal to light and sharp touch Moves extremities without ataxia, coordination intact Normal  finger to nose and rapid alternating movements Neg romberg, no pronator drift Normal gait Normal heel-shin and balance   Psychiatric:        Behavior: Behavior normal.    ED Results / Procedures / Treatments   Labs (all labs ordered are listed, but only abnormal results are displayed) Labs Reviewed  BASIC METABOLIC PANEL - Abnormal; Notable for the following components:      Result Value   Chloride 112 (*)    All other components within normal limits  CBC WITH DIFFERENTIAL/PLATELET  HCG, QUANTITATIVE, PREGNANCY    EKG None  Radiology No results found.  Procedures Procedures   Medications Ordered in ED Medications - No data to display  ED Course  I have reviewed the triage vital signs and the nursing notes.  Pertinent labs & imaging results that were available during my care of the patient were reviewed by me and considered in my medical decision making (see chart for details).    MDM Rules/Calculators/A&P                           35 year old female here with visual loss.  She describes a circular region of the left eye with some black squiggly lines lasting about 20 minutes.  She is able to see around this area.  All of her symptoms have resolved.  She has normal vision at this time and no neurologic deficits otherwise.  MRIs are currently pending.  Have given signout to PA abdomen.  Plan to follow-up on MRIs and touch base with neurology. Final Clinical Impression(s) / ED Diagnoses Final diagnoses:  None    Rx / DC Orders ED Discharge Orders     None        Arthor Captain, PA-C 05/31/21 1502    Melene Plan, DO 05/31/21 1516

## 2021-05-31 NOTE — ED Notes (Signed)
Patient is still gone for testing. Unable to update vitals.

## 2021-05-31 NOTE — ED Triage Notes (Signed)
Pt reports PTA she experienced partial vision loss in left eye around 1000. The episode lasted about 20 mins but has resolved completely.

## 2021-06-05 DIAGNOSIS — I809 Phlebitis and thrombophlebitis of unspecified site: Secondary | ICD-10-CM | POA: Diagnosis not present

## 2021-06-06 DIAGNOSIS — R600 Localized edema: Secondary | ICD-10-CM | POA: Diagnosis not present

## 2021-06-06 DIAGNOSIS — I808 Phlebitis and thrombophlebitis of other sites: Secondary | ICD-10-CM | POA: Diagnosis not present

## 2021-06-06 DIAGNOSIS — I809 Phlebitis and thrombophlebitis of unspecified site: Secondary | ICD-10-CM | POA: Diagnosis not present

## 2021-06-06 DIAGNOSIS — I82611 Acute embolism and thrombosis of superficial veins of right upper extremity: Secondary | ICD-10-CM | POA: Diagnosis not present

## 2021-06-06 DIAGNOSIS — M79601 Pain in right arm: Secondary | ICD-10-CM | POA: Diagnosis not present

## 2021-07-24 ENCOUNTER — Other Ambulatory Visit (HOSPITAL_COMMUNITY): Payer: Self-pay | Admitting: Cardiology

## 2021-07-24 DIAGNOSIS — M79605 Pain in left leg: Secondary | ICD-10-CM | POA: Diagnosis not present

## 2021-07-24 DIAGNOSIS — I809 Phlebitis and thrombophlebitis of unspecified site: Secondary | ICD-10-CM | POA: Diagnosis not present

## 2021-07-24 DIAGNOSIS — M79662 Pain in left lower leg: Secondary | ICD-10-CM

## 2021-07-25 ENCOUNTER — Ambulatory Visit (HOSPITAL_COMMUNITY)
Admission: RE | Admit: 2021-07-25 | Discharge: 2021-07-25 | Disposition: A | Discharge status: Home / Self Care | Payer: BC Managed Care – PPO | Source: Ambulatory Visit | Attending: Cardiology | Admitting: Cardiology

## 2021-07-25 ENCOUNTER — Other Ambulatory Visit: Payer: Self-pay

## 2021-07-25 DIAGNOSIS — M79662 Pain in left lower leg: Secondary | ICD-10-CM | POA: Diagnosis not present

## 2021-07-25 DIAGNOSIS — M7989 Other specified soft tissue disorders: Secondary | ICD-10-CM

## 2021-07-25 NOTE — Progress Notes (Signed)
LLE venous duplex has been completed.  Attempted to call with preliminary results, however number provided was incorrect.    Results can be found under chart review under CV PROC. 07/25/2021 8:34 AM December Hedtke RVT, RDMS

## 2021-07-27 ENCOUNTER — Emergency Department (HOSPITAL_BASED_OUTPATIENT_CLINIC_OR_DEPARTMENT_OTHER)
Admission: EM | Admit: 2021-07-27 | Discharge: 2021-07-27 | Disposition: A | Discharge status: Home / Self Care | Payer: BC Managed Care – PPO | Attending: Emergency Medicine | Admitting: Emergency Medicine

## 2021-07-27 ENCOUNTER — Encounter (HOSPITAL_BASED_OUTPATIENT_CLINIC_OR_DEPARTMENT_OTHER): Payer: Self-pay

## 2021-07-27 DIAGNOSIS — I8002 Phlebitis and thrombophlebitis of superficial vessels of left lower extremity: Secondary | ICD-10-CM | POA: Diagnosis not present

## 2021-07-27 DIAGNOSIS — I82812 Embolism and thrombosis of superficial veins of left lower extremities: Secondary | ICD-10-CM | POA: Diagnosis not present

## 2021-07-27 DIAGNOSIS — M79662 Pain in left lower leg: Secondary | ICD-10-CM | POA: Diagnosis not present

## 2021-07-27 HISTORY — DX: Anxiety disorder, unspecified: F41.9

## 2021-07-27 HISTORY — DX: Acute embolism and thrombosis of unspecified vein: I82.90

## 2021-07-27 NOTE — ED Triage Notes (Addendum)
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 US performed on 07/25/21 at [facility]. Was told to wear compression socks and has been taking 324 ASA and Ibuprofen. Pt is present for ongoing pain of left upper leg. Rates pain 3/10 but 0/10 just sitting there. 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 Has a referral to vein clinic on Monday.

## 2021-07-27 NOTE — Discharge Instructions (Addendum)
You were seen today with concerns for worsening thrombosis.  Continue warm compresses at home with anti-inflammatory medications and aspirin.  Follow-up with your primary physician.

## 2021-07-27 NOTE — ED Provider Notes (Signed)
[facility] EMERGENCY DEPT Provider Note   CSN: [id number] Arrival date & time: 07/27/21  1931     History Chief Complaint  Patient presents with   Leg Pain    Kayla Butler is a 35 y.o. female.  HPI     THis is a 35 year old female who presents with worsening leg redness.  Patient reports that she woke up on Wednesday morning with redness and pain to the left lower leg.  She saw her primary physician who ordered an outpatient DVT study.  It was negative for DVT but she had thrombosis of the superficial vein.  She has been using warm compresses, ibuprofen, and aspirin.  She reports that she has referral to a vein center.  She has also been using compresses.  She states that the redness has extended proximally and she was told to come to the ER if it got worse.  She reports minimal pain when she is resting.  She rates 5 out of 10 pain when she is standing.  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  She is not had any fevers.  No chest pain or shortness of breath.  Denies calf pain or tenderness.  Has had 1 prior episode of thrombophlebitis.  Past Medical History:  Diagnosis Date   Anxiety    Thrombosis     There are no problems to display for this patient.   History reviewed. No pertinent surgical history.   OB History   No obstetric history on file.     No family history on file.  Social History   Tobacco Use   Smoking status: Never   Smokeless tobacco: Never  Vaping Use   Vaping Use: Every day  Substance Use Topics   Alcohol use: Never   Drug use: Never    Home Medications Prior to Admission medications   Medication Sig Start Date End Date Taking? Authorizing Provider  itraconazole (SPORANOX) 100 MG capsule Take 1 capsule (100 mg total) by mouth daily. Patient not taking: Reported on 05/31/2021 08/20/20   Candelaria Stagers, DPM  PORTIA-28 0.15-30 MG-MCG tablet Take 1 tablet by mouth daily.  07/07/20   [provider]  sertraline (ZOLOFT) 100 MG tablet Take 100 mg by mouth daily. 03/13/21   [provider]  terbinafine (LAMISIL) 250 MG tablet Take 1 tablet (250 mg total) by mouth daily. Patient not taking: Reported on 05/31/2021 07/29/20   Candelaria Stagers, DPM    Allergies    Patient has no known allergies.  Review of Systems   Review of Systems  Constitutional:  Negative for fever.  Respiratory:  Negative for chest tightness.   Cardiovascular:  Negative for chest pain.  Gastrointestinal:  Negative for abdominal pain.  Skin:  Positive for color change.  All other systems reviewed and are negative.  Physical Exam Updated Vital Signs BP 108/63 (BP Location: Right Arm)   Pulse 74   Temp 98.3 F (36.8 C)   Resp 16   Ht 1.676 m (5\' 6" )   Wt 117.9 kg   LMP  (LMP Unknown)   SpO2 98%   BMI 41.97 kg/m   Physical Exam Vitals and nursing note reviewed.  Constitutional:      Appearance: She is well-developed. She is obese. She is not ill-appearing.  HENT:     Head: Normocephalic and atraumatic.     Nose: Nose normal.     Mouth/Throat:  Mouth: Mucous membranes are moist.  Eyes:     Pupils: Pupils are equal, round, and reactive to light.  Cardiovascular:     Rate and Rhythm: Normal rate and regular rhythm.  Pulmonary:     Effort: Pulmonary effort is normal. No respiratory distress.  Abdominal:     Palpations: Abdomen is soft.  Musculoskeletal:        General: No tenderness.     Cervical back: Neck supple.     Right lower leg: No edema.     Left lower leg: No edema.  Skin:    General: Skin is warm and dry.     Comments: Erythema and tenderness to palpation with a palpable cord over the lateral aspect of the left leg just proximal to the knee, no fluctuance  Neurological:     Mental Status: She is alert and oriented to person, place, and time.  Psychiatric:        Mood and Affect: Mood normal.    ED Results / Procedures / Treatments    Labs (all labs ordered are listed, but only abnormal results are displayed) Labs Reviewed - No data to display  EKG None  Radiology No results found.  Procedures Procedures   Medications Ordered in ED Medications - No data to display  ED Course  I have reviewed the triage vital signs and the nursing notes.  Pertinent labs & imaging results that were available during my care of the patient were reviewed by me and considered in my medical decision making (see chart for details).    MDM Rules/Calculators/A&P                           Patient presents with concern for worsening thrombosis of the superficial vessel.  I have reviewed her ultrasound results.  No DVT present.  She has no signs or symptoms of DVT.  She does have palpable cord superficially of the left leg which is consistent with thrombosis/thrombophlebitis.  She is mostly concerned that she is on her feet for a large portion of the day without rest.  She gets relief when resting and using supportive measures.  She is appropriately taking NSAIDs and aspirin.  She was provided with a work note for 2 days.  She has follow-up with her primary physician and the vein clinic.  Low suspicion for DVT.  Do not feel she needs further imaging or work-up.  After history, exam, and medical workup I feel the patient has been appropriately medically screened and is safe for discharge home. Pertinent diagnoses were discussed with the patient. Patient was given return precautions.  Final Clinical Impression(s) / ED Diagnoses Final diagnoses:  Superficial thrombosis of left lower extremity    Rx / DC Orders ED Discharge Orders     None        Shon Baton, MD 07/27/21 2348

## 2021-07-27 NOTE — ED Notes (Signed)
MD made aware of patient's status and sx. No new orders.

## 2021-08-13 ENCOUNTER — Encounter: Payer: Self-pay | Admitting: *Deleted

## 2021-09-02 ENCOUNTER — Ambulatory Visit (INDEPENDENT_AMBULATORY_CARE_PROVIDER_SITE_OTHER): Payer: BC Managed Care – PPO | Admitting: Vascular Surgery

## 2021-09-02 ENCOUNTER — Encounter: Payer: Self-pay | Admitting: Vascular Surgery

## 2021-09-02 ENCOUNTER — Other Ambulatory Visit: Payer: Self-pay

## 2021-09-02 DIAGNOSIS — I872 Venous insufficiency (chronic) (peripheral): Secondary | ICD-10-CM | POA: Diagnosis not present

## 2021-09-02 NOTE — Progress Notes (Signed)
Patient name: Kayla Butler MRN: [id number] DOB: [birth year] Sex: female  REASON FOR CONSULT: Further management of superficial vein thrombosis of left lower extremity  HPI: Kayla Butler is a 35 y.o. female, with history of anxiety that presents for further evaluation of superficial thrombophlebitis of a left lower extremity varicose vein.  Patient states she initially had a blood clot in her right arm after an IV was placed earlier this year.  Most recently she had an event in early November when she developed pain on the lateral part of her left leg.  She is on her feet for long periods of time and ultimately discussed with her PCP who ordered a DVT study.  The study from 07/25/2021 shows acute superficial vein thrombosis in the left leg varicosity.  She is wearing compression stockings now.  Her symptoms have completely resolved.  She has no pain in the leg.  She did have a change in birth control around the symptom onset.  Past Medical History:  Diagnosis Date   Anxiety    Thrombosis     History reviewed. No pertinent surgical history.  History reviewed. No pertinent family history.  SOCIAL HISTORY: Social History   Socioeconomic History   Marital status: Unknown    Spouse name: Not on file   Number of children: Not on file   Years of education: Not on file   Highest education level: Not on file  Occupational History   Not on file  Tobacco Use   Smoking status: Never   Smokeless tobacco: Never  Vaping Use   Vaping Use: Every day  Substance and Sexual Activity   Alcohol use: Never   Drug use: Never   Sexual activity: Never  Other Topics Concern   Not on file  Social History Narrative   Not on file   Social Determinants of Health   Financial Resource Strain: Not on file  Food Insecurity: Not on file  Transportation Needs: Not on file  Physical Activity: Not on file  Stress: Not on file  Social Connections: Not on file  Intimate Partner Violence: Not on  file    No Known Allergies  Current Outpatient Medications  Medication Sig Dispense Refill   sertraline (ZOLOFT) 100 MG tablet Take 100 mg by mouth daily.     itraconazole (SPORANOX) 100 MG capsule Take 1 capsule (100 mg total) by mouth daily. (Patient not taking: Reported on 05/31/2021) 90 capsule 0   PORTIA-28 0.15-30 MG-MCG tablet Take 1 tablet by mouth daily. (Patient not taking: Reported on 09/02/2021)     terbinafine (LAMISIL) 250 MG tablet Take 1 tablet (250 mg total) by mouth daily. (Patient not taking: Reported on 05/31/2021) 90 tablet 0   No current facility-administered medications for this visit.    REVIEW OF SYSTEMS:  [X]  denotes positive finding, [ ]  denotes negative finding Cardiac  Comments:  Chest pain or chest pressure:    Shortness of breath upon exertion:    Short of breath when lying flat:    Irregular heart rhythm:        Vascular    Pain in calf, thigh, or hip brought on by ambulation:    Pain in feet at night that wakes you up from your sleep:     Blood clot in your veins:    Leg swelling:         Pulmonary    Oxygen at home:    Productive cough:     Wheezing:  Neurologic    Sudden weakness in arms or legs:     Sudden numbness in arms or legs:     Sudden onset of difficulty speaking or slurred speech:    Temporary loss of vision in one eye:     Problems with dizziness:         Gastrointestinal    Blood in stool:     Vomited blood:         Genitourinary    Burning when urinating:     Blood in urine:        Psychiatric    Major depression:         Hematologic    Bleeding problems:    Problems with blood clotting too easily:        Skin    Rashes or ulcers:        Constitutional    Fever or chills:      PHYSICAL EXAM: Vitals:   09/02/21 1047  BP: 125/83  Pulse: 93  Resp: 14  Temp: 98.1 F (36.7 C)  TempSrc: Temporal  SpO2: 97%  Weight: 260 lb (117.9 kg)  Height: 5\' 6"  (1.676 m)    GENERAL: The patient is a  well-nourished female, in no acute distress. The vital signs are documented above. CARDIAC: There is a regular rate and rhythm.  VASCULAR:  Palpable DP pulses bilateral No lower extremity venous ulcers Multiple areas of spider veins in both lower extremities with some prominent varicosities as well PULMONARY: No respiratory distress. ABDOMEN: Soft and non-tender  MUSCULOSKELETAL: There are no major deformities or cyanosis. NEUROLOGIC: No focal weakness or paresthesias are detected. SKIN: There are no ulcers or rashes noted. PSYCHIATRIC: The patient has a normal affect.  DATA:   DVT study 07/25/2021 shows no evidence of DVT with acute superficial vein thrombosis in the left leg varicosity  Assessment/Plan:  35 year old female presents for evaluation of superficial venous thrombosis in left leg varicosity.  I discussed with her in detail the nature of superficial venous thrombosis and discussed that this certainly does not carry the same risk as a blood clot in the deep venous system.  Discussed that this is typically managed with warm compresses and NSAIDs and fortunately she has already had complete symptom resolution.  There is no indication for anticoagulation from my standpoint.  In addition she does have evidence of venous insufficiency and I discussed we could proceed with venous reflux study to evaluate any additional options but she wants to defer this and is comfortable with her compression stockings.  She can contact 31 in the future if her symptoms worsen and will need further work-up for venous insufficency.   [place], MD [facility] Office: [phone]

## 2021-09-23 DIAGNOSIS — R599 Enlarged lymph nodes, unspecified: Secondary | ICD-10-CM | POA: Diagnosis not present

## 2021-09-30 DIAGNOSIS — M5416 Radiculopathy, lumbar region: Secondary | ICD-10-CM | POA: Diagnosis not present

## 2021-10-02 ENCOUNTER — Other Ambulatory Visit: Payer: Self-pay | Admitting: Physical Medicine and Rehabilitation

## 2021-10-02 DIAGNOSIS — M545 Low back pain, unspecified: Secondary | ICD-10-CM

## 2021-10-11 ENCOUNTER — Other Ambulatory Visit: Payer: Self-pay

## 2021-10-11 ENCOUNTER — Ambulatory Visit
Admission: RE | Admit: 2021-10-11 | Discharge: 2021-10-11 | Disposition: A | Discharge status: Home / Self Care | Payer: BC Managed Care – PPO | Source: Ambulatory Visit | Attending: Physical Medicine and Rehabilitation | Admitting: Physical Medicine and Rehabilitation

## 2021-10-11 DIAGNOSIS — M545 Low back pain, unspecified: Secondary | ICD-10-CM

## 2021-10-11 DIAGNOSIS — M419 Scoliosis, unspecified: Secondary | ICD-10-CM | POA: Diagnosis not present

## 2021-10-11 DIAGNOSIS — M5126 Other intervertebral disc displacement, lumbar region: Secondary | ICD-10-CM | POA: Diagnosis not present

## 2021-10-11 IMAGING — MR MR LUMBAR SPINE W/O CM
4 of 5 series · 25 of 48 positions shown · non-contrast
Comparison: None available.

CLINICAL DATA: Initial evaluation for low back pain with left lower
extremity pain, weakness, and numbness.

EXAM:
MRI LUMBAR SPINE WITHOUT CONTRAST
TECHNIQUE: Multiplanar, multisequence MR imaging of the lumbar spine was
performed. No intravenous contrast was administered.

[Series 3: T2 · sagittal · 4.0mm · 0.53mm/px · 6 of 15 slices shown (1 of 2)]
[im 1/15]
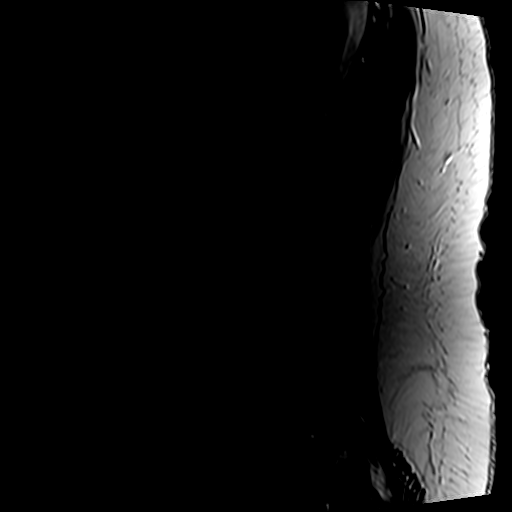
[im 3/15]
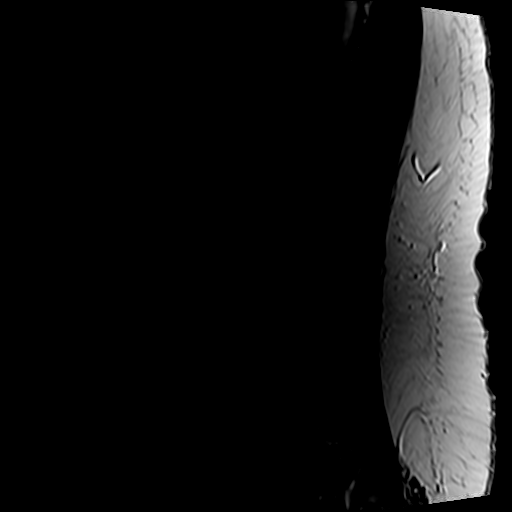
[im 6/15]
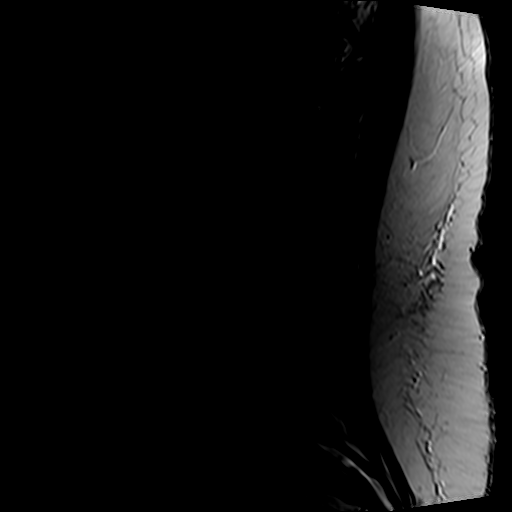
[im 9/15]
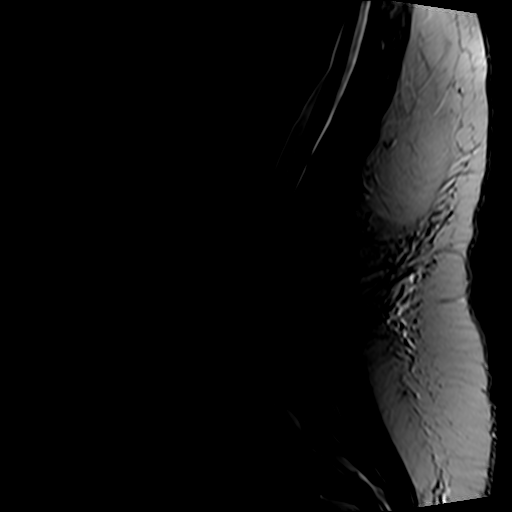
[im 12/15]
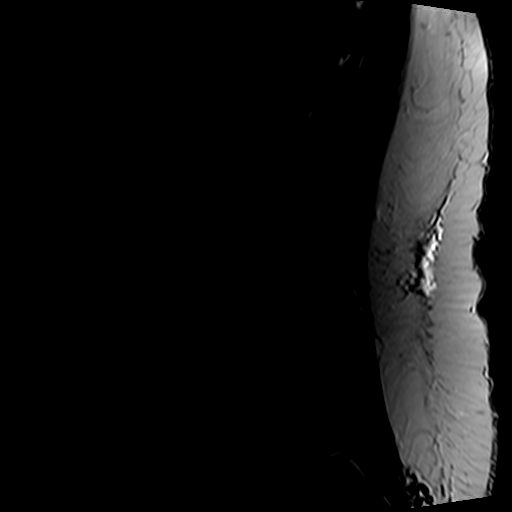
[im 15/15]
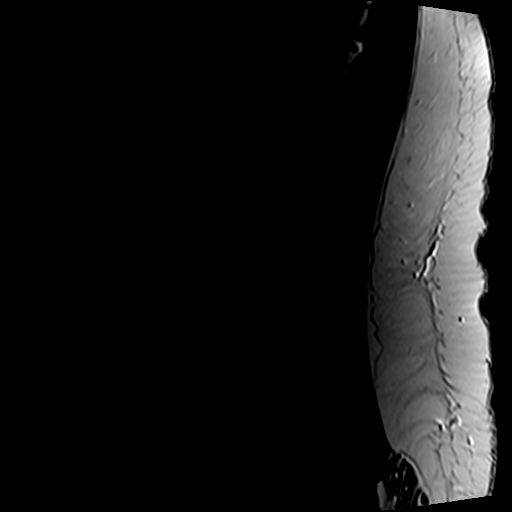

[Series 5: T1 · sagittal · 4.0mm · 0.53mm/px · 6 of 15 slices shown (1 of 2)]
[im 1/15]
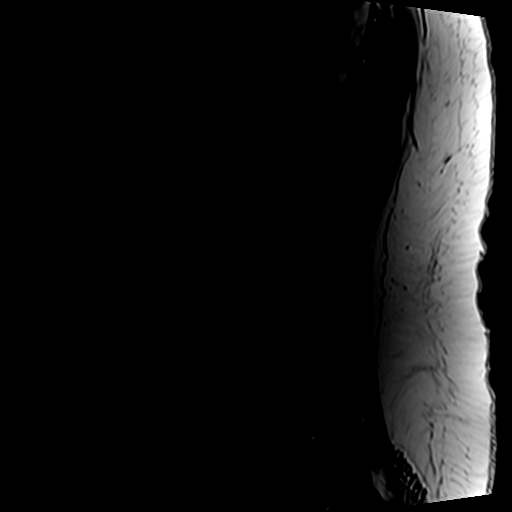
[im 3/15]
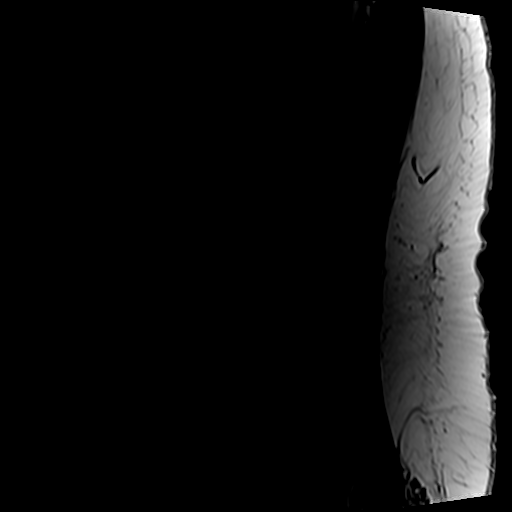
[im 6/15]
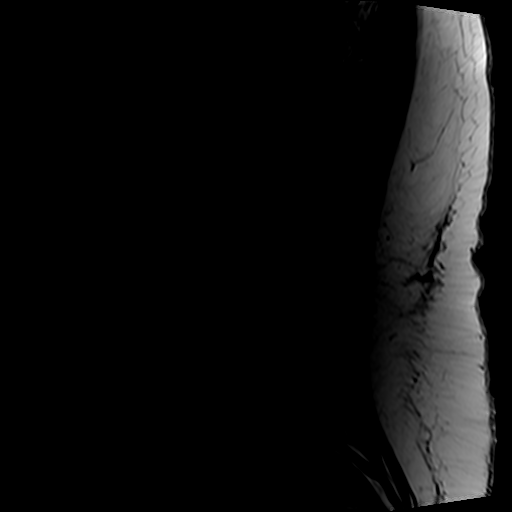
[im 9/15]
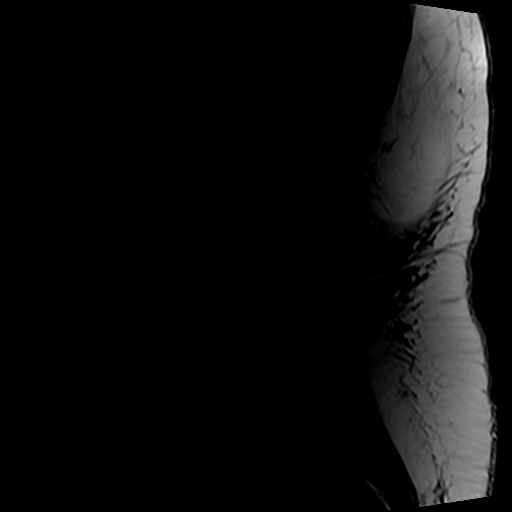
[im 12/15]
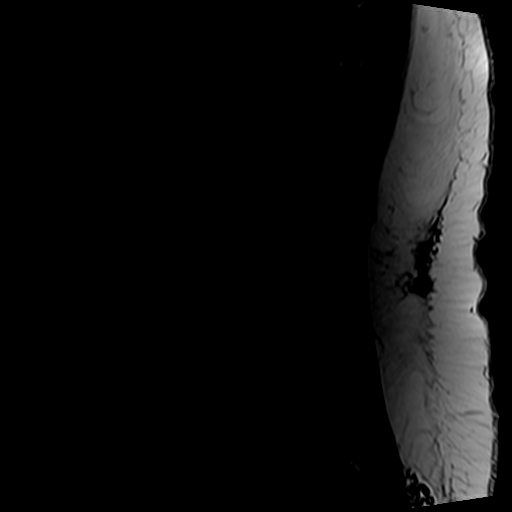
[im 15/15]
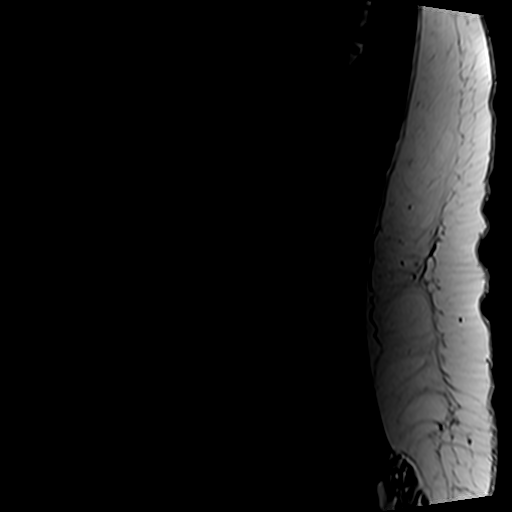

[Series 6: T2 · axial · 4.0mm · 0.70mm/px · z∈[-72,+133]mm · 9 of 37 slices shown (2 of 2)]
[im 1/37]
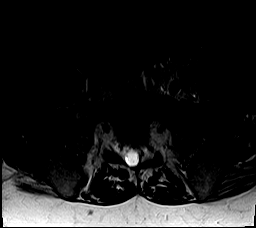
[im 6/37]
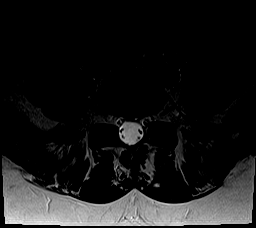
[im 11/37]
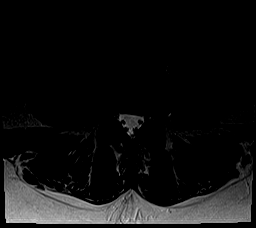
[im 16/37]
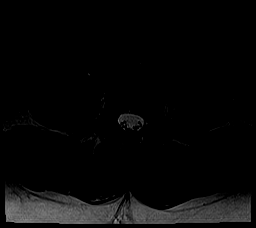
[im 19/37]
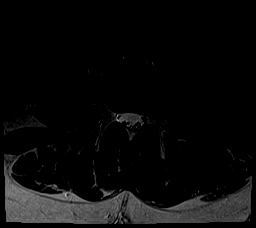
[im 21/37]
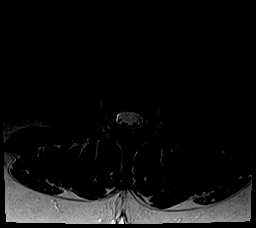
[im 26/37]
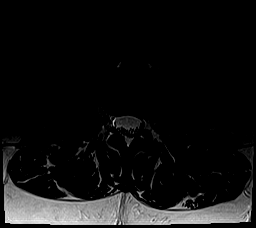
[im 31/37]
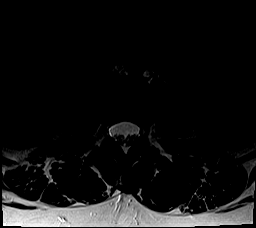
[im 37/37]
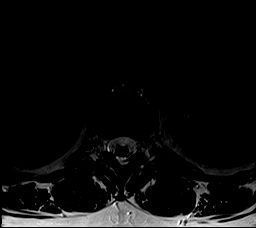

[Series 7: T1 · axial · 4.0mm · 0.35mm/px · z∈[-72,+102]mm · 4 of 37 slices shown (2 of 2)]
[im 1/37]
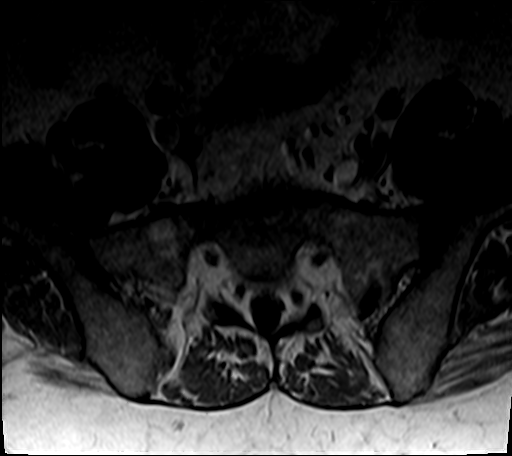
[im 6/37]
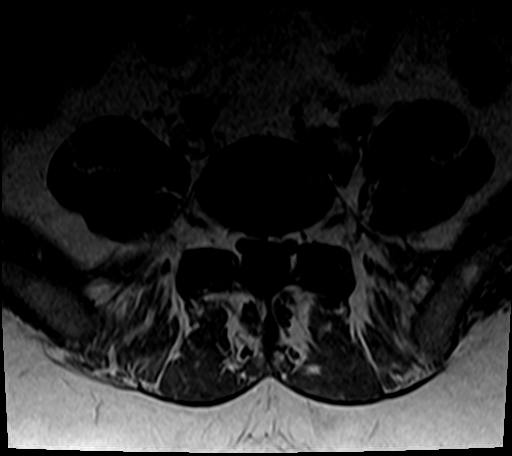
[im 19/37]
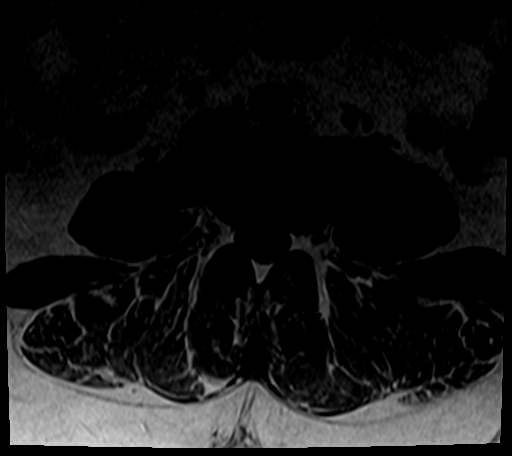
[im 31/37]
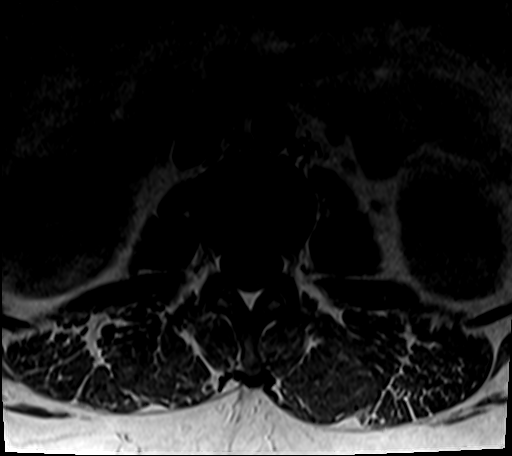

[25 of 48 positions shown; findings below may reference images not displayed]

FINDINGS: Segmentation: Standard. Lowest well-formed disc space labeled the
L5-S1 level.

Alignment: Mild levoscoliosis. Alignment otherwise normal with
preservation of the normal lumbar lordosis. No listhesis.

Vertebrae: Vertebral body height maintained without acute or chronic
fracture. Bone marrow signal intensity diffusely decreased on T1
weighted sequence, nonspecific, but most commonly related to anemia,
smoking or obesity. No discrete or worrisome osseous lesions. No
abnormal marrow edema.

Conus medullaris and cauda equina: Conus extends to the L1 level.
Conus and cauda equina appear normal.

Paraspinal and other soft tissues: Unremarkable.

Disc levels:

T11-12: Disc desiccation with minimal annular disc bulge. No
stenosis.

T12-L1: Unremarkable.

L1-2:  Unremarkable.

L2-3:  Unremarkable.

L3-4: Mild disc bulge with disc desiccation. Superimposed small left
foraminal to extraforaminal disc protrusion closely approximates the
exiting left L3 nerve root (series 6, image 20). Mild facet
hypertrophy. No spinal stenosis. Foramina remain patent.

L4-5: Disc desiccation with mild disc bulge. Tiny shallow left
extraforaminal disc protrusion closely approximates the exiting left
L4 nerve root (series 6, image 26). Mild facet hypertrophy. No
spinal stenosis. Foramina remain patent.

L5-S1: Normal interspace. Mild facet hypertrophy. No stenosis or
impingement.
IMPRESSION: 1. Small left foraminal to extraforaminal disc protrusions at L3-4
and L4-5, closely approximating and potentially irritating the
exiting left L3 and L4 nerve roots respectively.
2. Mild bilateral facet hypertrophy at L3-4 through L5-S1.
3. Underlying mild levoscoliosis.

## 2021-10-28 DIAGNOSIS — M5416 Radiculopathy, lumbar region: Secondary | ICD-10-CM | POA: Diagnosis not present

## 2021-10-28 DIAGNOSIS — M5126 Other intervertebral disc displacement, lumbar region: Secondary | ICD-10-CM | POA: Diagnosis not present

## 2021-11-03 DIAGNOSIS — M5416 Radiculopathy, lumbar region: Secondary | ICD-10-CM | POA: Diagnosis not present

## 2021-11-25 DIAGNOSIS — M5416 Radiculopathy, lumbar region: Secondary | ICD-10-CM | POA: Diagnosis not present

## 2021-11-25 DIAGNOSIS — M5126 Other intervertebral disc displacement, lumbar region: Secondary | ICD-10-CM | POA: Diagnosis not present

## 2021-12-02 DIAGNOSIS — M5116 Intervertebral disc disorders with radiculopathy, lumbar region: Secondary | ICD-10-CM | POA: Diagnosis not present

## 2021-12-09 DIAGNOSIS — M5116 Intervertebral disc disorders with radiculopathy, lumbar region: Secondary | ICD-10-CM | POA: Diagnosis not present

## 2021-12-16 DIAGNOSIS — M5116 Intervertebral disc disorders with radiculopathy, lumbar region: Secondary | ICD-10-CM | POA: Diagnosis not present

## 2021-12-24 DIAGNOSIS — M5116 Intervertebral disc disorders with radiculopathy, lumbar region: Secondary | ICD-10-CM | POA: Diagnosis not present

## 2022-01-13 DIAGNOSIS — M5416 Radiculopathy, lumbar region: Secondary | ICD-10-CM | POA: Diagnosis not present

## 2022-01-13 DIAGNOSIS — M5126 Other intervertebral disc displacement, lumbar region: Secondary | ICD-10-CM | POA: Diagnosis not present

## 2022-01-13 DIAGNOSIS — M5116 Intervertebral disc disorders with radiculopathy, lumbar region: Secondary | ICD-10-CM | POA: Diagnosis not present

## 2022-01-15 DIAGNOSIS — Z03818 Encounter for observation for suspected exposure to other biological agents ruled out: Secondary | ICD-10-CM | POA: Diagnosis not present

## 2022-01-15 DIAGNOSIS — J029 Acute pharyngitis, unspecified: Secondary | ICD-10-CM | POA: Diagnosis not present

## 2022-01-15 DIAGNOSIS — R5383 Other fatigue: Secondary | ICD-10-CM | POA: Diagnosis not present

## 2022-01-15 DIAGNOSIS — R051 Acute cough: Secondary | ICD-10-CM | POA: Diagnosis not present

## 2022-01-15 DIAGNOSIS — J020 Streptococcal pharyngitis: Secondary | ICD-10-CM | POA: Diagnosis not present

## 2022-01-28 DIAGNOSIS — M5116 Intervertebral disc disorders with radiculopathy, lumbar region: Secondary | ICD-10-CM | POA: Diagnosis not present

## 2022-02-03 DIAGNOSIS — F418 Other specified anxiety disorders: Secondary | ICD-10-CM | POA: Diagnosis not present

## 2022-03-17 ENCOUNTER — Ambulatory Visit (INDEPENDENT_AMBULATORY_CARE_PROVIDER_SITE_OTHER): Payer: BC Managed Care – PPO | Admitting: Adult Health

## 2022-03-17 ENCOUNTER — Encounter: Payer: Self-pay | Admitting: Adult Health

## 2022-03-17 VITALS — BP 122/90 | HR 74 | Ht 66.0 in | Wt 267.0 lb

## 2022-03-17 DIAGNOSIS — F411 Generalized anxiety disorder: Secondary | ICD-10-CM | POA: Diagnosis not present

## 2022-03-17 DIAGNOSIS — F410 Panic disorder [episodic paroxysmal anxiety] without agoraphobia: Secondary | ICD-10-CM

## 2022-03-17 DIAGNOSIS — F902 Attention-deficit hyperactivity disorder, combined type: Secondary | ICD-10-CM | POA: Diagnosis not present

## 2022-03-17 DIAGNOSIS — F428 Other obsessive-compulsive disorder: Secondary | ICD-10-CM

## 2022-03-17 MED ORDER — AMPHETAMINE-DEXTROAMPHET ER 20 MG PO CP24: 20.0000 mg | ORAL_CAPSULE | Freq: Every day | ORAL | 0 refills | Status: DC

## 2022-03-17 MED ORDER — SERTRALINE HCL 100 MG PO TABS: 100.0000 mg | ORAL_TABLET | Freq: Every day | ORAL | 1 refills | Status: DC

## 2022-03-31 ENCOUNTER — Telehealth: Payer: Self-pay | Admitting: Adult Health

## 2022-03-31 NOTE — Telephone Encounter (Signed)
Please review

## 2022-03-31 NOTE — Telephone Encounter (Signed)
Ok sounds good

## 2022-03-31 NOTE — Telephone Encounter (Signed)
Pt would like info on how to go about getting an animal registered as an ESA.   Next appt 7/25

## 2022-03-31 NOTE — Telephone Encounter (Signed)
She does not have the animal yet but is thinking about getting a cat.She has an appt on 7/25 informed her she may need to discuss then

## 2022-03-31 NOTE — Telephone Encounter (Signed)
Call and get information about the animal. May need an appointment to discuss.

## 2022-04-14 ENCOUNTER — Encounter: Payer: Self-pay | Admitting: Adult Health

## 2022-04-14 ENCOUNTER — Ambulatory Visit (INDEPENDENT_AMBULATORY_CARE_PROVIDER_SITE_OTHER): Payer: BC Managed Care – PPO | Admitting: Adult Health

## 2022-04-14 DIAGNOSIS — F902 Attention-deficit hyperactivity disorder, combined type: Secondary | ICD-10-CM

## 2022-04-14 DIAGNOSIS — F410 Panic disorder [episodic paroxysmal anxiety] without agoraphobia: Secondary | ICD-10-CM

## 2022-04-14 DIAGNOSIS — F411 Generalized anxiety disorder: Secondary | ICD-10-CM | POA: Diagnosis not present

## 2022-04-14 DIAGNOSIS — F428 Other obsessive-compulsive disorder: Secondary | ICD-10-CM | POA: Diagnosis not present

## 2022-04-14 MED ORDER — AMPHETAMINE-DEXTROAMPHET ER 20 MG PO CP24: 20.0000 mg | ORAL_CAPSULE | Freq: Every day | ORAL | 0 refills | Status: DC

## 2022-04-14 NOTE — Progress Notes (Signed)
[name] [id number] March 15, 1986 36 y.o.  Subjective:   Patient ID:  Kayla Butler is a 36 y.o. (DOB 09-15-1986) female.  Chief Complaint: No chief complaint on file.   HPI Kayla Butler presents to the office today for follow-up of ADHD, MDD, GAD and obsessional thoughts and acts.   Describes mood today as "better". Pleasant. Denies tearfulness. Mood symptoms - denies depression, anxiety and irritability. Denies worry and rumination. Denies obsessive thoughts. Denies irrational thoughts. Currently taking Zoloft 100mg  daily to manage mood symptoms. Feels like the addition of Adderall has been very helpful - would like to continue current dose. Stable interest and motivation. Taking medications as prescribed.  Energy levels improved. Active, does not have a regular exercise routine.  Enjoys some usual interests and activities. Single. No children. Lives alone with pig. Mother and sister local. Brother in [place]. Spending time with family. Has a coworker that is a friend.  Appetite adequate. Weight stable - 260 pounds. Sleeps well most nights. Averages 8 to 9 hours. Focus and concentration improved. Managing aspects of household. Works as a [place] - since high school. Denies SI or HI.  Denies AH or VH. Denies self harm. Denies substance use. Denies alcohol use.   Previous medication trials:  Zoloft  Flowsheet Row ED from 07/27/2021 in [facility] ED from 05/31/2021 in [facility]  C-SSRS RISK CATEGORY No Risk No Risk        Review of Systems:  Review of Systems  Musculoskeletal:  Negative for gait problem.  Neurological:  Negative for tremors.  Psychiatric/Behavioral:         Please refer to HPI    Medications: I have reviewed the patient's current medications.  Current Outpatient Medications  Medication Sig Dispense Refill   [START ON 05/12/2022] amphetamine-dextroamphetamine  (ADDERALL XR) 20 MG 24 hr capsule Take 1 capsule (20 mg total) by mouth daily. 30 capsule 0   [START ON 06/09/2022] amphetamine-dextroamphetamine (ADDERALL XR) 20 MG 24 hr capsule Take 1 capsule (20 mg total) by mouth daily. 30 capsule 0   amphetamine-dextroamphetamine (ADDERALL XR) 20 MG 24 hr capsule Take 1 capsule (20 mg total) by mouth daily. 30 capsule 0   sertraline (ZOLOFT) 100 MG tablet Take 1 tablet (100 mg total) by mouth daily. 90 tablet 1   sertraline (ZOLOFT) 100 MG tablet Take 100 mg by mouth daily.     No current facility-administered medications for this visit.    Medication Side Effects: None  Allergies: No Known Allergies  Past Medical History:  Diagnosis Date   Anxiety    Thrombosis     Past Medical History, Surgical history, Social history, and Family history were reviewed and updated as appropriate.   Please see review of systems for further details on the patient's review from today.   Objective:   Physical Exam:  There were no vitals taken for this visit.  Physical Exam Constitutional:      General: She is not in acute distress. Musculoskeletal:        General: No deformity.  Neurological:     Mental Status: She is alert and oriented to person, place, and time.     Coordination: Coordination normal.  Psychiatric:        Attention and Perception: Attention and perception normal. She does not perceive auditory or visual hallucinations.        Mood and Affect: Mood normal. Mood is not anxious or depressed. Affect is not  labile, blunt, angry or inappropriate.        Speech: Speech normal.        Behavior: Behavior normal.        Thought Content: Thought content normal. Thought content is not paranoid or delusional. Thought content does not include homicidal or suicidal ideation. Thought content does not include homicidal or suicidal plan.        Cognition and Memory: Cognition and memory normal.        Judgment: Judgment normal.     Comments: Insight  intact     Lab Review:     Component Value Date/Time   NA 142 05/31/2021 1145   K 4.4 05/31/2021 1145   CL 112 (H) 05/31/2021 1145   CO2 23 05/31/2021 1145   GLUCOSE 90 05/31/2021 1145   BUN 13 05/31/2021 1145   CREATININE 0.72 05/31/2021 1145   CALCIUM 9.3 05/31/2021 1145   PROT 6.2 07/19/2020 0942   ALBUMIN 3.8 07/19/2020 0942   AST 11 07/19/2020 0942   ALT 10 07/19/2020 0942   ALKPHOS 49 07/19/2020 0942   BILITOT <0.2 07/19/2020 0942   GFRNONAA >60 05/31/2021 1145       Component Value Date/Time   WBC 10.2 05/31/2021 1145   RBC 4.75 05/31/2021 1145   HGB 14.5 05/31/2021 1145   HCT 43.8 05/31/2021 1145   PLT 314 05/31/2021 1145   MCV 92.2 05/31/2021 1145   MCH 30.5 05/31/2021 1145   MCHC 33.1 05/31/2021 1145   RDW 13.3 05/31/2021 1145   LYMPHSABS 1.9 05/31/2021 1145   MONOABS 0.8 05/31/2021 1145   EOSABS 0.2 05/31/2021 1145   BASOSABS 0.0 05/31/2021 1145    No results found for: "POCLITH", "LITHIUM"   No results found for: "PHENYTOIN", "PHENOBARB", "VALPROATE", "CBMZ"   .res Assessment: Plan:    Plan:  PDMP reviewed  Zoloft 100mg  daily Adderall XR 20mg  daily 110/73/83  [facility] ADD screening - 47/58 ADHD likely  Time spent with patient was 25 minutes. Greater than 50% of face to face time with patient was spent on counseling and coordination of care.    RTC 3 months  Patient advised to contact office with any questions, adverse effects, or acute worsening in signs and symptoms.  Diagnoses and all orders for this visit:  Panic attacks  Generalized anxiety disorder  Attention deficit hyperactivity disorder (ADHD), combined type -     amphetamine-dextroamphetamine (ADDERALL XR) 20 MG 24 hr capsule; Take 1 capsule (20 mg total) by mouth daily. -     amphetamine-dextroamphetamine (ADDERALL XR) 20 MG 24 hr capsule; Take 1 capsule (20 mg total) by mouth daily. -     amphetamine-dextroamphetamine (ADDERALL XR) 20 MG 24 hr capsule; Take 1 capsule  (20 mg total) by mouth daily.  Obsessional thoughts     Please see After Visit Summary for patient specific instructions.  No future appointments.  No orders of the defined types were placed in this encounter.   -------------------------------

## 2022-04-15 DIAGNOSIS — M5416 Radiculopathy, lumbar region: Secondary | ICD-10-CM | POA: Diagnosis not present

## 2022-04-28 ENCOUNTER — Telehealth: Payer: Self-pay | Admitting: Adult Health

## 2022-04-28 ENCOUNTER — Other Ambulatory Visit: Payer: Self-pay

## 2022-04-28 MED ORDER — AMPHETAMINE-DEXTROAMPHET ER 30 MG PO CP24: 30.0000 mg | ORAL_CAPSULE | Freq: Every day | ORAL | 0 refills | Status: DC

## 2022-04-28 NOTE — Telephone Encounter (Signed)
Prosperity called this morning at 9:50am reporting that indeed the Adderall is wearing off during the day as you suspected might happen.  Can an adjustment be made?  Please advise. Pharmacy is [facility] at [facility].  Appt 10/24

## 2022-04-28 NOTE — Telephone Encounter (Signed)
Pended.

## 2022-04-28 NOTE — Telephone Encounter (Signed)
We can try the Adderall XR 30mg  if agreeable.

## 2022-04-28 NOTE — Telephone Encounter (Signed)
Please advise 

## 2022-05-04 DIAGNOSIS — R21 Rash and other nonspecific skin eruption: Secondary | ICD-10-CM | POA: Diagnosis not present

## 2022-05-11 DIAGNOSIS — J029 Acute pharyngitis, unspecified: Secondary | ICD-10-CM | POA: Diagnosis not present

## 2022-05-27 ENCOUNTER — Other Ambulatory Visit: Payer: Self-pay | Admitting: Adult Health

## 2022-06-03 ENCOUNTER — Ambulatory Visit (INDEPENDENT_AMBULATORY_CARE_PROVIDER_SITE_OTHER): Payer: BC Managed Care – PPO | Admitting: Podiatry

## 2022-06-03 DIAGNOSIS — B351 Tinea unguium: Secondary | ICD-10-CM | POA: Diagnosis not present

## 2022-06-03 DIAGNOSIS — Q6672 Congenital pes cavus, left foot: Secondary | ICD-10-CM | POA: Diagnosis not present

## 2022-06-03 DIAGNOSIS — Z79899 Other long term (current) drug therapy: Secondary | ICD-10-CM | POA: Diagnosis not present

## 2022-06-03 DIAGNOSIS — Q667 Congenital pes cavus, unspecified foot: Secondary | ICD-10-CM

## 2022-06-03 DIAGNOSIS — Q6671 Congenital pes cavus, right foot: Secondary | ICD-10-CM

## 2022-06-03 NOTE — Progress Notes (Signed)
Subjective:  Patient ID: Kayla Butler, female    DOB: [birth year],  MRN: [id number]  Chief Complaint  Patient presents with   Nail Problem    36 y.o. female presents with the above complaint.  Patient presents with complaint of left hallux onychomycosis.  Patient states is been coming a little loose because of fungus underneath it.  She has not tried any medication she has not seen anyone else prior to seeing me.  She would like to discuss treatment options for this.  She denies any other acute complaint she has not had any trauma to the area.  She would like to discuss other treatment options that are available to her.  She also like to get orthotics.  To help support her foot.  She denies any other acute complaints   Review of Systems: Negative except as noted in the HPI. Denies N/V/F/Ch.  Past Medical History:  Diagnosis Date   Anxiety    Thrombosis     Current Outpatient Medications:    amphetamine-dextroamphetamine (ADDERALL XR) 20 MG 24 hr capsule, Take 1 capsule (20 mg total) by mouth daily., Disp: 30 capsule, Rfl: 0   amphetamine-dextroamphetamine (ADDERALL XR) 20 MG 24 hr capsule, Take 1 capsule (20 mg total) by mouth daily., Disp: 30 capsule, Rfl: 0   [START ON 06/09/2022] amphetamine-dextroamphetamine (ADDERALL XR) 20 MG 24 hr capsule, Take 1 capsule (20 mg total) by mouth daily., Disp: 30 capsule, Rfl: 0   amphetamine-dextroamphetamine (ADDERALL XR) 30 MG 24 hr capsule, Take 1 capsule (30 mg total) by mouth daily., Disp: 30 capsule, Rfl: 0   sertraline (ZOLOFT) 100 MG tablet, Take 1 tablet (100 mg total) by mouth daily., Disp: 90 tablet, Rfl: 1   sertraline (ZOLOFT) 100 MG tablet, Take 100 mg by mouth daily., Disp: , Rfl:   Social History   Tobacco Use  Smoking Status Never  Smokeless Tobacco Never    No Known Allergies Objective:  There were no vitals filed for this visit. There is no height or weight on file to calculate BMI. Constitutional Well  developed. Well nourished.  Vascular Dorsalis pedis pulses palpable bilaterally. Posterior tibial pulses palpable bilaterally. Capillary refill normal to all digits.  No cyanosis or clubbing noted. Pedal hair growth normal.  Neurologic Normal speech. Oriented to person, place, and time. Epicritic sensation to light touch grossly present bilaterally.  Dermatologic  thickened discolored dystrophic nail noted to the left hallux.  Mild pain on palpation. Gait examination shows pes cavus high arch foot structure with calcaneal varus high arch deformity anterior apex of the deformity noted at the midfoot.  Orthopedic: Normal joint ROM without pain or crepitus bilaterally. No visible deformities. No bony tenderness.   Radiographs: None Assessment:   1. Long-term use of high-risk medication   2. Pes cavus   3. Onychomycosis due to dermatophyte   4. Nail fungus    Plan:  Patient was evaluated and treated and all questions answered.  Left hallux onychomycosis -Educated the patient on the etiology of onychomycosis and various treatment options associated with improving the fungal load.  I explained to the patient that there is 3 treatment options available to treat the onychomycosis including topical, p.o., laser treatment.  Patient elected to undergo p.o. options with Lamisil/terbinafine therapy.  In order for me to start the medication therapy, I explained to the patient the importance of evaluating the liver and obtaining the liver function test.  Once the liver function test comes back normal I will start him  on 64-month course of Lamisil therapy.  Patient understood all risk and would like to proceed with Lamisil therapy.  I have asked the patient to immediately stop the Lamisil therapy if she has any reactions to it and call the office or go to the emergency room right away.  Patient states understanding  Pes pes cavus -I explained to patient the etiology of pes planovalgus and relationship  with Planter fasciitis and various treatment options were discussed.  Given patient foot structure in the setting of arch pain I believe patient will benefit from custom-made orthotics to help control the hindfoot motion support the arch of the foot and take the stress away from plantar fascial.  Patient agrees with the plan like to proceed with orthotics -Patient was casted for orthotics    No follow-ups on file.

## 2022-06-04 LAB — HEPATIC FUNCTION PANEL
AG Ratio: 1.5 (calc) (ref 1.0–2.5)
ALT: 11 U/L (ref 6–29)
AST: 12 U/L (ref 10–30)
Albumin: 4.1 g/dL (ref 3.6–5.1)
Alkaline phosphatase (APISO): 64 U/L (ref 31–125)
Bilirubin, Direct: 0.1 mg/dL (ref 0.0–0.2)
Globulin: 2.8 g/dL (calc) (ref 1.9–3.7)
Indirect Bilirubin: 0.6 mg/dL (calc) (ref 0.2–1.2)
Total Bilirubin: 0.7 mg/dL (ref 0.2–1.2)
Total Protein: 6.9 g/dL (ref 6.1–8.1)

## 2022-06-04 MED ORDER — TERBINAFINE HCL 250 MG PO TABS: 250.0000 mg | ORAL_TABLET | Freq: Every day | ORAL | 0 refills | Status: AC

## 2022-06-04 NOTE — Addendum Note (Signed)
Addended by: Nicholes Rough on: 06/04/2022 07:44 AM   Modules accepted: Orders

## 2022-06-16 DIAGNOSIS — M5416 Radiculopathy, lumbar region: Secondary | ICD-10-CM | POA: Diagnosis not present

## 2022-06-16 DIAGNOSIS — M5126 Other intervertebral disc displacement, lumbar region: Secondary | ICD-10-CM | POA: Diagnosis not present

## 2022-06-23 ENCOUNTER — Other Ambulatory Visit: Payer: BC Managed Care – PPO

## 2022-07-01 ENCOUNTER — Ambulatory Visit (INDEPENDENT_AMBULATORY_CARE_PROVIDER_SITE_OTHER): Payer: BC Managed Care – PPO | Admitting: Podiatry

## 2022-07-01 DIAGNOSIS — Q667 Congenital pes cavus, unspecified foot: Secondary | ICD-10-CM

## 2022-07-01 NOTE — Progress Notes (Signed)
Patient presents today to pick up custom molded foot orthotics recommended by Dr. Posey Pronto  .   Orthotics were dispensed and fit was satisfactory. Reviewed instructions for break-in and wear. Written instructions given to patient.  Patient will follow up as needed.  Patient agreed to pay the balance via a payment plan. $100 was applied to the acct today.   [facility] - order # [id number]

## 2022-07-12 DIAGNOSIS — R5383 Other fatigue: Secondary | ICD-10-CM | POA: Diagnosis not present

## 2022-07-12 DIAGNOSIS — J029 Acute pharyngitis, unspecified: Secondary | ICD-10-CM | POA: Diagnosis not present

## 2022-07-12 DIAGNOSIS — R051 Acute cough: Secondary | ICD-10-CM | POA: Diagnosis not present

## 2022-07-12 DIAGNOSIS — U071 COVID-19: Secondary | ICD-10-CM | POA: Diagnosis not present

## 2022-07-14 ENCOUNTER — Telehealth (INDEPENDENT_AMBULATORY_CARE_PROVIDER_SITE_OTHER): Payer: BC Managed Care – PPO | Admitting: Adult Health

## 2022-07-14 ENCOUNTER — Encounter: Payer: Self-pay | Admitting: Adult Health

## 2022-07-14 DIAGNOSIS — F410 Panic disorder [episodic paroxysmal anxiety] without agoraphobia: Secondary | ICD-10-CM

## 2022-07-14 DIAGNOSIS — F902 Attention-deficit hyperactivity disorder, combined type: Secondary | ICD-10-CM

## 2022-07-14 DIAGNOSIS — F909 Attention-deficit hyperactivity disorder, unspecified type: Secondary | ICD-10-CM | POA: Insufficient documentation

## 2022-07-14 DIAGNOSIS — F411 Generalized anxiety disorder: Secondary | ICD-10-CM | POA: Diagnosis not present

## 2022-07-14 DIAGNOSIS — F428 Other obsessive-compulsive disorder: Secondary | ICD-10-CM

## 2022-07-14 DIAGNOSIS — F419 Anxiety disorder, unspecified: Secondary | ICD-10-CM | POA: Insufficient documentation

## 2022-07-14 MED ORDER — AMPHETAMINE-DEXTROAMPHET ER 20 MG PO CP24: 20.0000 mg | ORAL_CAPSULE | Freq: Two times a day (BID) | ORAL | 0 refills | Status: DC

## 2022-07-14 MED ORDER — SERTRALINE HCL 100 MG PO TABS: 100.0000 mg | ORAL_TABLET | Freq: Every day | ORAL | 1 refills | Status: DC

## 2022-07-14 NOTE — Progress Notes (Signed)
Kayla Butler [id number] 1985/11/25 36 y.o.  Virtual Visit via Video Note  I connected with pt @ on 07/14/22 at  9:40 AM EDT by a video enabled telemedicine application and verified that I am speaking with the correct person using two identifiers.   I discussed the limitations of evaluation and management by telemedicine and the availability of in person appointments. The patient expressed understanding and agreed to proceed.  I discussed the assessment and treatment plan with the patient. The patient was provided an opportunity to ask questions and all were answered. The patient agreed with the plan and demonstrated an understanding of the instructions.   The patient was advised to call back or seek an in-person evaluation if the symptoms worsen or if the condition fails to improve as anticipated.  I provided 25 minutes of non-face-to-face time during this encounter.  The patient was located at home.  The provider was located at [place].   Kayla Gell, NP   Subjective:   Patient ID:  Kayla Butler is a 36 y.o. (DOB 1986-08-18) female.  Chief Complaint: No chief complaint on file.   HPI Kayla Butler presents for follow-up of ADHD, MDD, GAD and obsessional thoughts and acts.   Describes mood today as "better". Pleasant. Denies tearfulness. Mood symptoms - denies depression, anxiety and irritability. Denies worry and rumination. Denies obsessive thoughts. Denies irrational thoughts. Stating "I'm doing pretty good". Feels like medications are helpful. Stable interest and motivation. Taking medications as prescribed.  Energy levels improved. Active, does not have a regular exercise routine.  Enjoys some usual interests and activities. Single. No children. Lives alone with [name] and cat "Marley". Mother and sister local. Brother in [place]. Spending time with family. Appetite adequate. Weight loss - 250 from 260 pounds. Sleeps well most nights.  Averages 8 to 9 hours. Focus and concentration improved. Managing aspects of household. Works as a Emergency planning/management officer. Denies SI or HI.  Denies AH or VH. Denies self harm. Denies substance use. Denies alcohol use.   Previous medication trials:  Zoloft   Review of Systems:  Review of Systems  Musculoskeletal:  Negative for gait problem.  Neurological:  Negative for tremors.  Psychiatric/Behavioral:         Please refer to HPI    Medications: I have reviewed the patient's current medications.  Current Outpatient Medications  Medication Sig Dispense Refill   amphetamine-dextroamphetamine (ADDERALL XR) 20 MG 24 hr capsule Take 1 capsule (20 mg total) by mouth 2 (two) times daily. 60 capsule 0   [START ON 08/11/2022] amphetamine-dextroamphetamine (ADDERALL XR) 20 MG 24 hr capsule Take 1 capsule (20 mg total) by mouth 2 (two) times daily. 60 capsule 0   [START ON 09/08/2022] amphetamine-dextroamphetamine (ADDERALL XR) 20 MG 24 hr capsule Take 1 capsule (20 mg total) by mouth 2 (two) times daily. 60 capsule 0   sertraline (ZOLOFT) 100 MG tablet Take 1 tablet (100 mg total) by mouth daily. 90 tablet 1   terbinafine (LAMISIL) 250 MG tablet Take 1 tablet (250 mg total) by mouth daily. 90 tablet 0   No current facility-administered medications for this visit.    Medication Side Effects: None  Allergies: No Known Allergies  Past Medical History:  Diagnosis Date   Anxiety    Thrombosis     No family history on file.  Social History   Socioeconomic History   Marital status: Single    Spouse name: Not on file   Number of children:  Not on file   Years of education: Not on file   Highest education level: Not on file  Occupational History   Not on file  Tobacco Use   Smoking status: Never   Smokeless tobacco: Never  Vaping Use   Vaping Use: Every day  Substance and Sexual Activity   Alcohol use: Never   Drug use: Never   Sexual activity: Never  Other Topics Concern   Not on  file  Social History Narrative   ** Merged History Encounter **       Social Determinants of Health   Financial Resource Strain: Not on file  Food Insecurity: Not on file  Transportation Needs: Not on file  Physical Activity: Not on file  Stress: Not on file  Social Connections: Not on file  Intimate Partner Violence: Not on file    Past Medical History, Surgical history, Social history, and Family history were reviewed and updated as appropriate.   Please see review of systems for further details on the patient's review from today.   Objective:   Physical Exam:  There were no vitals taken for this visit.  Physical Exam Constitutional:      General: She is not in acute distress. Musculoskeletal:        General: No deformity.  Neurological:     Mental Status: She is alert and oriented to person, place, and time.     Coordination: Coordination normal.  Psychiatric:        Attention and Perception: Attention and perception normal. She does not perceive auditory or visual hallucinations.        Mood and Affect: Mood normal. Mood is not anxious or depressed. Affect is not labile, blunt, angry or inappropriate.        Speech: Speech normal.        Behavior: Behavior normal.        Thought Content: Thought content normal. Thought content is not paranoid or delusional. Thought content does not include homicidal or suicidal ideation. Thought content does not include homicidal or suicidal plan.        Cognition and Memory: Cognition and memory normal.        Judgment: Judgment normal.     Comments: Insight intact     Lab Review:     Component Value Date/Time   NA 142 05/31/2021 1145   K 4.4 05/31/2021 1145   CL 112 (H) 05/31/2021 1145   CO2 23 05/31/2021 1145   GLUCOSE 90 05/31/2021 1145   BUN 13 05/31/2021 1145   CREATININE 0.72 05/31/2021 1145   CALCIUM 9.3 05/31/2021 1145   PROT 6.9 06/03/2022 1130   PROT 6.2 07/19/2020 0942   ALBUMIN 3.8 07/19/2020 0942   AST 12  06/03/2022 1130   ALT 11 06/03/2022 1130   ALKPHOS 49 07/19/2020 0942   BILITOT 0.7 06/03/2022 1130   BILITOT <0.2 07/19/2020 0942   GFRNONAA >60 05/31/2021 1145       Component Value Date/Time   WBC 10.2 05/31/2021 1145   RBC 4.75 05/31/2021 1145   HGB 14.5 05/31/2021 1145   HCT 43.8 05/31/2021 1145   PLT 314 05/31/2021 1145   MCV 92.2 05/31/2021 1145   MCH 30.5 05/31/2021 1145   MCHC 33.1 05/31/2021 1145   RDW 13.3 05/31/2021 1145   LYMPHSABS 1.9 05/31/2021 1145   MONOABS 0.8 05/31/2021 1145   EOSABS 0.2 05/31/2021 1145   BASOSABS 0.0 05/31/2021 1145    No results found for: "POCLITH", "LITHIUM"  No results found for: "PHENYTOIN", "PHENOBARB", "VALPROATE", "CBMZ"   .res Assessment: Plan:    Plan:  PDMP reviewed  Zoloft 100mg  daily Adderall XR 30mg  to XR 20mg  BID daily - medication not lasting the duration of the work day.  Monitor BP between visits while taking stimulant medication.    [facility] ADD screening - 47/58 ADHD likely  Time spent with patient was 25 minutes. Greater than 50% of face to face time with patient was spent on counseling and coordination of care.    RTC 3 months  Patient advised to contact office with any questions, adverse effects, or acute worsening in signs and symptoms. Diagnoses and all orders for this visit:  Attention deficit hyperactivity disorder (ADHD), combined type -     amphetamine-dextroamphetamine (ADDERALL XR) 20 MG 24 hr capsule; Take 1 capsule (20 mg total) by mouth 2 (two) times daily. -     amphetamine-dextroamphetamine (ADDERALL XR) 20 MG 24 hr capsule; Take 1 capsule (20 mg total) by mouth 2 (two) times daily. -     amphetamine-dextroamphetamine (ADDERALL XR) 20 MG 24 hr capsule; Take 1 capsule (20 mg total) by mouth 2 (two) times daily.  Panic attacks -     sertraline (ZOLOFT) 100 MG tablet; Take 1 tablet (100 mg total) by mouth daily.  Generalized anxiety disorder -     sertraline (ZOLOFT) 100 MG tablet;  Take 1 tablet (100 mg total) by mouth daily.  Obsessional thoughts -     sertraline (ZOLOFT) 100 MG tablet; Take 1 tablet (100 mg total) by mouth daily.     Please see After Visit Summary for patient specific instructions.  Future Appointments  Date Time Provider Department Center  10/07/2022  8:45 AM , DPM [facility]    No orders of the defined types were placed in this encounter.     -------------------------------

## 2022-08-17 DIAGNOSIS — G4710 Hypersomnia, unspecified: Secondary | ICD-10-CM | POA: Diagnosis not present

## 2022-08-17 DIAGNOSIS — R0683 Snoring: Secondary | ICD-10-CM | POA: Diagnosis not present

## 2022-09-11 DIAGNOSIS — G4719 Other hypersomnia: Secondary | ICD-10-CM | POA: Diagnosis not present

## 2022-09-23 DIAGNOSIS — Z79899 Other long term (current) drug therapy: Secondary | ICD-10-CM | POA: Diagnosis not present

## 2022-09-23 DIAGNOSIS — Z6841 Body Mass Index (BMI) 40.0 and over, adult: Secondary | ICD-10-CM | POA: Diagnosis not present

## 2022-09-23 DIAGNOSIS — Z124 Encounter for screening for malignant neoplasm of cervix: Secondary | ICD-10-CM | POA: Diagnosis not present

## 2022-09-23 DIAGNOSIS — Z202 Contact with and (suspected) exposure to infections with a predominantly sexual mode of transmission: Secondary | ICD-10-CM | POA: Diagnosis not present

## 2022-09-23 DIAGNOSIS — Z0000 Encounter for general adult medical examination without abnormal findings: Secondary | ICD-10-CM | POA: Diagnosis not present

## 2022-10-07 ENCOUNTER — Ambulatory Visit (INDEPENDENT_AMBULATORY_CARE_PROVIDER_SITE_OTHER): Payer: BC Managed Care – PPO | Admitting: Podiatry

## 2022-10-07 VITALS — BP 134/64

## 2022-10-07 DIAGNOSIS — Q667 Congenital pes cavus, unspecified foot: Secondary | ICD-10-CM | POA: Diagnosis not present

## 2022-10-07 DIAGNOSIS — Z79899 Other long term (current) drug therapy: Secondary | ICD-10-CM

## 2022-10-07 DIAGNOSIS — B351 Tinea unguium: Secondary | ICD-10-CM | POA: Diagnosis not present

## 2022-10-07 DIAGNOSIS — G4733 Obstructive sleep apnea (adult) (pediatric): Secondary | ICD-10-CM | POA: Diagnosis not present

## 2022-10-07 NOTE — Progress Notes (Signed)
  Subjective:  Patient ID: Kayla Butler, female    DOB: [birth year],  MRN: [id number]  Chief Complaint  Patient presents with   Toe Pain    37 y.o. female presents with the above complaint.  Patient presents with complaint of left hallux pain.  Patient states the orthotics helped some but not much.  Patient wanted to get it evaluated.  Patient states that she cuts a hole through  Of the orthotics.  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  She denies any other acute issues pain scale 7 out of 10 hurts with ambulation worse with pressure   Review of Systems: Negative except as noted in the HPI. Denies N/V/F/Ch.  Past Medical History:  Diagnosis Date   Anxiety    Thrombosis     Current Outpatient Medications:    amphetamine-dextroamphetamine (ADDERALL XR) 20 MG 24 hr capsule, Take 1 capsule (20 mg total) by mouth 2 (two) times daily., Disp: 60 capsule, Rfl: 0   amphetamine-dextroamphetamine (ADDERALL XR) 20 MG 24 hr capsule, Take 1 capsule (20 mg total) by mouth 2 (two) times daily., Disp: 60 capsule, Rfl: 0   amphetamine-dextroamphetamine (ADDERALL XR) 20 MG 24 hr capsule, Take 1 capsule (20 mg total) by mouth 2 (two) times daily., Disp: 60 capsule, Rfl: 0   sertraline (ZOLOFT) 100 MG tablet, Take 1 tablet (100 mg total) by mouth daily., Disp: 90 tablet, Rfl: 1   terbinafine (LAMISIL) 250 MG tablet, Take 1 tablet (250 mg total) by mouth daily., Disp: 90 tablet, Rfl: 0  Social History   Tobacco Use  Smoking Status Never  Smokeless Tobacco Never    No Known Allergies Objective:   Vitals:   10/07/22 0850  BP: 134/64   There is no height or weight on file to calculate BMI. Constitutional Well developed. Well nourished.  Vascular Dorsalis pedis pulses palpable bilaterally. Posterior tibial pulses palpable bilaterally. Capillary refill normal to all digits.  No cyanosis or clubbing noted. Pedal hair growth normal.  Neurologic Normal  speech. Oriented to person, place, and time. Epicritic sensation to light touch grossly present bilaterally.  Dermatologic Left hallux malleus semiflexible with pain at the distal tip of the toe with hyperkeratotic lesion.  No arthritis noted at the IPJ joint or the MPJ joint of the first  Orthopedic: Normal joint ROM without pain or crepitus bilaterally. No visible deformities. No bony tenderness.   Radiographs: None Assessment:  No diagnosis found. Plan:  Patient was evaluated and treated and all questions answered.  Left hallux malleus flexible -All questions and concerns were discussed with the patient in extensive detail -At this time I discussed with the patient that she will benefit from shoe gear modification which she will go even wider in the toebox as well as toe protectors and sleeves.  If there is no improvement we may need to discuss tenotomy in the future. -Toe protectors were dispensed  No follow-ups on file.

## 2022-10-09 DIAGNOSIS — G4733 Obstructive sleep apnea (adult) (pediatric): Secondary | ICD-10-CM | POA: Diagnosis not present

## 2022-10-16 ENCOUNTER — Telehealth: Payer: Self-pay | Admitting: Adult Health

## 2022-10-16 ENCOUNTER — Other Ambulatory Visit: Payer: Self-pay

## 2022-10-16 DIAGNOSIS — F902 Attention-deficit hyperactivity disorder, combined type: Secondary | ICD-10-CM

## 2022-10-16 MED ORDER — AMPHETAMINE-DEXTROAMPHET ER 20 MG PO CP24: 20.0000 mg | ORAL_CAPSULE | Freq: Two times a day (BID) | ORAL | 0 refills | Status: DC

## 2022-10-16 NOTE — Telephone Encounter (Signed)
Pt called at 1:30p.  She was at pharmacy to pick up Adderall 20mg  and they said they didn't have a script.  Pls send to [facility] in [place].  Next appt 1/30

## 2022-10-16 NOTE — Telephone Encounter (Signed)
Pended.

## 2022-10-20 ENCOUNTER — Ambulatory Visit (INDEPENDENT_AMBULATORY_CARE_PROVIDER_SITE_OTHER): Payer: BC Managed Care – PPO | Admitting: Adult Health

## 2022-10-20 ENCOUNTER — Encounter: Payer: Self-pay | Admitting: Adult Health

## 2022-10-20 DIAGNOSIS — F411 Generalized anxiety disorder: Secondary | ICD-10-CM | POA: Diagnosis not present

## 2022-10-20 DIAGNOSIS — F902 Attention-deficit hyperactivity disorder, combined type: Secondary | ICD-10-CM | POA: Diagnosis not present

## 2022-10-20 DIAGNOSIS — F428 Other obsessive-compulsive disorder: Secondary | ICD-10-CM

## 2022-10-20 DIAGNOSIS — F410 Panic disorder [episodic paroxysmal anxiety] without agoraphobia: Secondary | ICD-10-CM

## 2022-10-20 MED ORDER — SERTRALINE HCL 100 MG PO TABS: 100.0000 mg | ORAL_TABLET | Freq: Every day | ORAL | 1 refills | Status: DC

## 2022-10-20 MED ORDER — AMPHETAMINE-DEXTROAMPHET ER 20 MG PO CP24: 20.0000 mg | ORAL_CAPSULE | Freq: Two times a day (BID) | ORAL | 0 refills | Status: DC

## 2022-10-20 NOTE — Progress Notes (Signed)
DANNY ZIMNY [id number] 01-17-1986 37 y.o.  Subjective:   Patient ID:  Kayla Butler is a 37 y.o. (DOB 1985/10/17) female.  Chief Complaint: No chief complaint on file.   HPI Kayla Butler presents to the office today for follow-up of ADHD, MDD, GAD and obsessional thoughts and acts.   Describes mood today as "ok". Pleasant. Denies tearfulness. Mood symptoms - denies depression, anxiety and irritability. Denies worry and rumination. Denies obsessive thoughts. Denies irrational thoughts. Mood is consistent. Stating "I'm doing good". Feels like medications are helpful. Stable interest and motivation. Taking medications as prescribed.  Energy levels improved. Active, does not have a regular exercise routine.  Enjoys some usual interests and activities. Single - dating some. No children. Lives alone with [name] and 2 cats "[name]". Mother and sister local. Brother in [place]. Spending time with family. Appetite adequate. Weight loss - 250 pounds. Sleeps well most nights. Averages 8 to 9 hours. Recent sleep study and was diagnosed with mild sleep apnea - awaiting a CPAP machine. Focus and concentration improved. Managing aspects of household. 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 Denies SI or HI.  Denies AH or VH. Denies self harm. Denies substance use. Denies alcohol use.   Previous medication trials:  [place] [facility] from 07/27/2021 in [facility] Emergency Department at [facility] from 05/31/2021 in [facility] Emergency Department at [address] No Risk No Risk        Review of Systems:  Review of Systems  Musculoskeletal:  Negative for gait problem.  Neurological:  Negative for tremors.  Psychiatric/Behavioral:         Please refer to HPI    Medications: I have reviewed the patient's current medications.  Current Outpatient Medications  Medication Sig Dispense Refill    amphetamine-dextroamphetamine (ADDERALL XR) 20 MG 24 hr capsule Take 1 capsule (20 mg total) by mouth 2 (two) times daily. 60 capsule 0   [START ON 11/17/2022] amphetamine-dextroamphetamine (ADDERALL XR) 20 MG 24 hr capsule Take 1 capsule (20 mg total) by mouth 2 (two) times daily. 60 capsule 0   [START ON 12/15/2022] amphetamine-dextroamphetamine (ADDERALL XR) 20 MG 24 hr capsule Take 1 capsule (20 mg total) by mouth 2 (two) times daily. 60 capsule 0   sertraline (ZOLOFT) 100 MG tablet Take 1 tablet (100 mg total) by mouth daily. 90 tablet 1   terbinafine (LAMISIL) 250 MG tablet Take 1 tablet (250 mg total) by mouth daily. 90 tablet 0   No current facility-administered medications for this visit.    Medication Side Effects: None  Allergies: No Known Allergies  Past Medical History:  Diagnosis Date   Anxiety    Thrombosis     Past Medical History, Surgical history, Social history, and Family history were reviewed and updated as appropriate.   Please see review of systems for further details on the patient's review from today.   Objective:   Physical Exam:  There were no vitals taken for this visit.  Physical Exam Constitutional:      General: She is not in acute distress. Musculoskeletal:        General: No deformity.  Neurological:     Mental Status: She is alert and oriented to person, place, and time.     Coordination: Coordination normal.  Psychiatric:        Attention and Perception: Attention and perception normal. She does not perceive auditory or visual  hallucinations.        Mood and Affect: Mood normal. Mood is not anxious or depressed. Affect is not labile, blunt, angry or inappropriate.        Speech: Speech normal.        Behavior: Behavior normal.        Thought Content: Thought content normal. Thought content is not paranoid or delusional. Thought content does not include homicidal or suicidal ideation. Thought content does not include homicidal or suicidal plan.         Cognition and Memory: Cognition and memory normal.        Judgment: Judgment normal.     Comments: Insight intact     Lab Review:     Component Value Date/Time   NA 142 05/31/2021 1145   K 4.4 05/31/2021 1145   CL 112 (H) 05/31/2021 1145   CO2 23 05/31/2021 1145   GLUCOSE 90 05/31/2021 1145   BUN 13 05/31/2021 1145   CREATININE 0.72 05/31/2021 1145   CALCIUM 9.3 05/31/2021 1145   PROT 6.9 06/03/2022 1130   PROT 6.2 07/19/2020 0942   ALBUMIN 3.8 07/19/2020 0942   AST 12 06/03/2022 1130   ALT 11 06/03/2022 1130   ALKPHOS 49 07/19/2020 0942   BILITOT 0.7 06/03/2022 1130   BILITOT <0.2 07/19/2020 0942   GFRNONAA >60 05/31/2021 1145       Component Value Date/Time   WBC 10.2 05/31/2021 1145   RBC 4.75 05/31/2021 1145   HGB 14.5 05/31/2021 1145   HCT 43.8 05/31/2021 1145   PLT 314 05/31/2021 1145   MCV 92.2 05/31/2021 1145   MCH 30.5 05/31/2021 1145   MCHC 33.1 05/31/2021 1145   RDW 13.3 05/31/2021 1145   LYMPHSABS 1.9 05/31/2021 1145   MONOABS 0.8 05/31/2021 1145   EOSABS 0.2 05/31/2021 1145   BASOSABS 0.0 05/31/2021 1145    No results found for: "POCLITH", "LITHIUM"   No results found for: "PHENYTOIN", "PHENOBARB", "VALPROATE", "CBMZ"   .res Assessment: Plan:    Plan:  PDMP reviewed  Zoloft 100mg  daily Adderall XR 20mg  BID daily - medication not lasting the duration of the work day.  Monitor BP between visits while taking stimulant medication.    [facility] ADD screening - 47/58 ADHD likely  Time spent with patient was 25 minutes. Greater than 50% of face to face time with patient was spent on counseling and coordination of care.    RTC 3 months  Patient advised to contact office with any questions, adverse effects, or acute worsening in signs and symptoms.  Diagnoses and all orders for this visit:  Generalized anxiety disorder -     sertraline (ZOLOFT) 100 MG tablet; Take 1 tablet (100 mg total) by mouth daily.  Panic attacks -      sertraline (ZOLOFT) 100 MG tablet; Take 1 tablet (100 mg total) by mouth daily.  Obsessional thoughts -     sertraline (ZOLOFT) 100 MG tablet; Take 1 tablet (100 mg total) by mouth daily.  Attention deficit hyperactivity disorder (ADHD), combined type -     amphetamine-dextroamphetamine (ADDERALL XR) 20 MG 24 hr capsule; Take 1 capsule (20 mg total) by mouth 2 (two) times daily. -     amphetamine-dextroamphetamine (ADDERALL XR) 20 MG 24 hr capsule; Take 1 capsule (20 mg total) by mouth 2 (two) times daily. -     amphetamine-dextroamphetamine (ADDERALL XR) 20 MG 24 hr capsule; Take 1 capsule (20 mg total) by mouth 2 (two) times daily.  Please see After Visit Summary for patient specific instructions.  No future appointments.  No orders of the defined types were placed in this encounter.   -------------------------------

## 2022-11-03 DIAGNOSIS — R400 Somnolence: Secondary | ICD-10-CM | POA: Diagnosis not present

## 2022-11-03 DIAGNOSIS — G4733 Obstructive sleep apnea (adult) (pediatric): Secondary | ICD-10-CM | POA: Diagnosis not present

## 2022-11-05 DIAGNOSIS — N898 Other specified noninflammatory disorders of vagina: Secondary | ICD-10-CM | POA: Diagnosis not present

## 2022-11-05 DIAGNOSIS — N939 Abnormal uterine and vaginal bleeding, unspecified: Secondary | ICD-10-CM | POA: Diagnosis not present

## 2022-11-14 DIAGNOSIS — L040 Acute lymphadenitis of face, head and neck: Secondary | ICD-10-CM | POA: Diagnosis not present

## 2022-11-14 DIAGNOSIS — N760 Acute vaginitis: Secondary | ICD-10-CM | POA: Diagnosis not present

## 2022-11-21 DIAGNOSIS — B9562 Methicillin resistant Staphylococcus aureus infection as the cause of diseases classified elsewhere: Secondary | ICD-10-CM | POA: Diagnosis not present

## 2022-11-21 DIAGNOSIS — Z008 Encounter for other general examination: Secondary | ICD-10-CM | POA: Diagnosis not present

## 2022-12-02 DIAGNOSIS — G4733 Obstructive sleep apnea (adult) (pediatric): Secondary | ICD-10-CM | POA: Diagnosis not present

## 2022-12-02 DIAGNOSIS — R400 Somnolence: Secondary | ICD-10-CM | POA: Diagnosis not present

## 2022-12-09 ENCOUNTER — Telehealth: Payer: Self-pay | Admitting: Adult Health

## 2022-12-09 NOTE — Telephone Encounter (Signed)
Patient called after hours line with mental health emergency. Reporting increase depression and anxiety. Options discussed. Will take patient out of work from 12/09/2022 through 0321/2024 for mood stabilization. Patient to set up an appointment and follow up in 2 days.

## 2022-12-23 ENCOUNTER — Ambulatory Visit (INDEPENDENT_AMBULATORY_CARE_PROVIDER_SITE_OTHER): Payer: BC Managed Care – PPO | Admitting: Adult Health

## 2022-12-23 ENCOUNTER — Encounter: Payer: Self-pay | Admitting: Adult Health

## 2022-12-23 DIAGNOSIS — F411 Generalized anxiety disorder: Secondary | ICD-10-CM | POA: Diagnosis not present

## 2022-12-23 DIAGNOSIS — F410 Panic disorder [episodic paroxysmal anxiety] without agoraphobia: Secondary | ICD-10-CM

## 2022-12-23 DIAGNOSIS — F428 Other obsessive-compulsive disorder: Secondary | ICD-10-CM

## 2022-12-23 DIAGNOSIS — F902 Attention-deficit hyperactivity disorder, combined type: Secondary | ICD-10-CM

## 2022-12-23 NOTE — Progress Notes (Signed)
[name] [id number] [birth year] 37 y.o.  Subjective:   Patient ID:  Kayla Butler is a 37 y.o. (DOB Apr 12, 1986) female.  Chief Complaint: No chief complaint on file.   HPI Kayla Butler presents to the office today for follow-up of ADHD, MDD, GAD and obsessional thoughts and acts.   Describes mood today as "ok". Pleasant. Denies tearfulness. Mood symptoms - denies depression, anxiety and irritability. Denies irrational thoughts. Denies worry, rumination, and over thinking. Denies obsessive thoughts. Mood is consistent. Stating "I'm doing better than I was". Feels like medications are helpful. Stable interest and motivation. Taking medications as prescribed.  Energy levels improved. Active, does not have a regular exercise routine.  Enjoys some usual interests and activities. Single - dating some. No children. Lives alone with [name] and 2 cats "[name]". Mother and sister local. Brother in [place]. Spending time with family. Appetite adequate. Weight loss - 250 pounds. Sleeps well most nights. Averages 8 to 9 hours. Recent sleep study - diagnosed with mild sleep apnea - started CPAP machine. Focus and concentration improved with medication. Managing aspects of household. 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 Denies SI or HI.  Denies AH or VH. Denies self harm. Denies substance use. Denies alcohol use.   Previous medication trials:  [place] ED from 07/27/2021 in [facility] Emergency Department at [facility] ED from 05/31/2021 in [facility] Emergency Department at [place] No Risk No Risk        Review of Systems:  Review of Systems  Musculoskeletal:  Negative for gait problem.  Neurological:  Negative for tremors.  Psychiatric/Behavioral:         Please refer to HPI    Medications: I have reviewed the patient's current medications.  Current Outpatient Medications  Medication Sig  Dispense Refill   amphetamine-dextroamphetamine (ADDERALL XR) 20 MG 24 hr capsule Take 1 capsule (20 mg total) by mouth 2 (two) times daily. 60 capsule 0   amphetamine-dextroamphetamine (ADDERALL XR) 20 MG 24 hr capsule Take 1 capsule (20 mg total) by mouth 2 (two) times daily. 60 capsule 0   amphetamine-dextroamphetamine (ADDERALL XR) 20 MG 24 hr capsule Take 1 capsule (20 mg total) by mouth 2 (two) times daily. 60 capsule 0   sertraline (ZOLOFT) 100 MG tablet Take 1 tablet (100 mg total) by mouth daily. 90 tablet 1   terbinafine (LAMISIL) 250 MG tablet Take 1 tablet (250 mg total) by mouth daily. 90 tablet 0   No current facility-administered medications for this visit.    Medication Side Effects: None  Allergies: No Known Allergies  Past Medical History:  Diagnosis Date   Anxiety    Thrombosis     Past Medical History, Surgical history, Social history, and Family history were reviewed and updated as appropriate.   Please see review of systems for further details on the patient's review from today.   Objective:   Physical Exam:  There were no vitals taken for this visit.  Physical Exam Constitutional:      General: She is not in acute distress. Musculoskeletal:        General: No deformity.  Neurological:     Mental Status: She is alert and oriented to person, place, and time.     Coordination: Coordination normal.  Psychiatric:        Attention and Perception: Attention and perception normal. She does not perceive auditory or visual  hallucinations.        Mood and Affect: Mood normal. Mood is not anxious or depressed. Affect is not labile, blunt, angry or inappropriate.        Speech: Speech normal.        Behavior: Behavior normal.        Thought Content: Thought content normal. Thought content is not paranoid or delusional. Thought content does not include homicidal or suicidal ideation. Thought content does not include homicidal or suicidal plan.        Cognition and  Memory: Cognition and memory normal.        Judgment: Judgment normal.     Comments: Insight intact     Lab Review:     Component Value Date/Time   NA 142 05/31/2021 1145   K 4.4 05/31/2021 1145   CL 112 (H) 05/31/2021 1145   CO2 23 05/31/2021 1145   GLUCOSE 90 05/31/2021 1145   BUN 13 05/31/2021 1145   CREATININE 0.72 05/31/2021 1145   CALCIUM 9.3 05/31/2021 1145   PROT 6.9 06/03/2022 1130   PROT 6.2 07/19/2020 0942   ALBUMIN 3.8 07/19/2020 0942   AST 12 06/03/2022 1130   ALT 11 06/03/2022 1130   ALKPHOS 49 07/19/2020 0942   BILITOT 0.7 06/03/2022 1130   BILITOT <0.2 07/19/2020 0942   GFRNONAA >60 05/31/2021 1145       Component Value Date/Time   WBC 10.2 05/31/2021 1145   RBC 4.75 05/31/2021 1145   HGB 14.5 05/31/2021 1145   HCT 43.8 05/31/2021 1145   PLT 314 05/31/2021 1145   MCV 92.2 05/31/2021 1145   MCH 30.5 05/31/2021 1145   MCHC 33.1 05/31/2021 1145   RDW 13.3 05/31/2021 1145   LYMPHSABS 1.9 05/31/2021 1145   MONOABS 0.8 05/31/2021 1145   EOSABS 0.2 05/31/2021 1145   BASOSABS 0.0 05/31/2021 1145    No results found for: "POCLITH", "LITHIUM"   No results found for: "PHENYTOIN", "PHENOBARB", "VALPROATE", "CBMZ"   .res Assessment: Plan:    Plan:  PDMP reviewed  Zoloft 150mg  daily Adderall XR 20mg  BID daily - medication not lasting the duration of the work day.  Monitor BP between visits while taking stimulant medication.    [facility] ADD screening - 47/58 ADHD likely  Time spent with patient was 25 minutes. Greater than 50% of face to face time with patient was spent on counseling and coordination of care.    RTC 4 weeks  Patient advised to contact office with any questions, adverse effects, or acute worsening in signs and symptoms.  Discussed potential benefits, risks, and side effects of stimulants with patient to include increased heart rate, palpitations, insomnia, increased anxiety, increased irritability, or decreased appetite.   Instructed patient to contact office if experiencing any significant tolerability issues.   There are no diagnoses linked to this encounter.   Please see After Visit Summary for patient specific instructions.  Future Appointments  Date Time Provider Craig  01/19/2023 10:40 AM Jacolyn Joaquin, Berdie Ogren, NP CP-CP None    No orders of the defined types were placed in this encounter.   -------------------------------

## 2023-01-02 DIAGNOSIS — R400 Somnolence: Secondary | ICD-10-CM | POA: Diagnosis not present

## 2023-01-02 DIAGNOSIS — G4733 Obstructive sleep apnea (adult) (pediatric): Secondary | ICD-10-CM | POA: Diagnosis not present

## 2023-01-19 ENCOUNTER — Ambulatory Visit: Payer: BC Managed Care – PPO | Admitting: Adult Health

## 2023-01-19 DIAGNOSIS — G4733 Obstructive sleep apnea (adult) (pediatric): Secondary | ICD-10-CM | POA: Diagnosis not present

## 2023-01-20 ENCOUNTER — Encounter: Payer: Self-pay | Admitting: Adult Health

## 2023-01-20 ENCOUNTER — Ambulatory Visit (INDEPENDENT_AMBULATORY_CARE_PROVIDER_SITE_OTHER): Payer: BC Managed Care – PPO | Admitting: Adult Health

## 2023-01-20 DIAGNOSIS — F411 Generalized anxiety disorder: Secondary | ICD-10-CM | POA: Diagnosis not present

## 2023-01-20 DIAGNOSIS — F410 Panic disorder [episodic paroxysmal anxiety] without agoraphobia: Secondary | ICD-10-CM | POA: Diagnosis not present

## 2023-01-20 DIAGNOSIS — F902 Attention-deficit hyperactivity disorder, combined type: Secondary | ICD-10-CM

## 2023-01-20 DIAGNOSIS — F428 Other obsessive-compulsive disorder: Secondary | ICD-10-CM | POA: Diagnosis not present

## 2023-01-20 MED ORDER — AMPHETAMINE-DEXTROAMPHET ER 20 MG PO CP24
20.0000 mg | ORAL_CAPSULE | Freq: Two times a day (BID) | ORAL | 0 refills | Status: DC
Start: 2023-02-17 — End: 2023-05-10

## 2023-01-20 MED ORDER — SERTRALINE HCL 100 MG PO TABS: ORAL_TABLET | ORAL | 1 refills | Status: DC

## 2023-01-20 MED ORDER — AMPHETAMINE-DEXTROAMPHET ER 20 MG PO CP24
20.0000 mg | ORAL_CAPSULE | Freq: Two times a day (BID) | ORAL | 0 refills | Status: DC
Start: 2023-03-17 — End: 2023-08-16

## 2023-01-20 MED ORDER — AMPHETAMINE-DEXTROAMPHET ER 20 MG PO CP24: 20.0000 mg | ORAL_CAPSULE | Freq: Two times a day (BID) | ORAL | 0 refills | Status: DC

## 2023-01-20 NOTE — Progress Notes (Signed)
Kayla Butler [id number] 1985/12/29 37 y.o.  Subjective:   Patient ID:  Kayla Butler is a 37 y.o. (DOB May 21, 1986) female.  Chief Complaint: No chief complaint on file.   HPI Kayla Butler presents to the office today for follow-up of ADHD, MDD, GAD and obsessional thoughts and acts.   Describes mood today as "ok". Pleasant. Denies tearfulness. Mood symptoms - denies depression, anxiety and irritability. Denies irrational thoughts. Denies obsessive thoughts. Denies worry, rumination, and over thinking. Mood is consistent. Stating "I'm doing pretty good". Feels like medications are helpful. Stable interest and motivation. Taking medications as prescribed.  Energy levels improved. Active, does not have a regular exercise routine.  Enjoys some usual interests and activities. Single - dating some. No children. Lives alone with [place] pig and 2 cats "[name]". Mother and sister local. Brother in [place]. Spending time with family. Appetite adequate. Weight stable - 250 pounds. Sleeps well most nights. Averages 8 to 9 hours. Diagnosed with mild sleep apnea - using CPAP machine. Focus and concentration improved with Adderall. Managing aspects of household. 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 Denies SI or HI.  Denies AH or VH. Denies self harm. Denies substance use. Denies alcohol use.   Previous medication trials:  Zoloft   Flowsheet Row ED from 07/27/2021 in [facility] Emergency Department at [facility] ED from 05/31/2021 in [facility] Emergency Department at [facility]  C-SSRS RISK CATEGORY No Risk No Risk        Review of Systems:  Review of Systems  Musculoskeletal:  Negative for gait problem.  Neurological:  Negative for tremors.  Psychiatric/Behavioral:         Please refer to HPI    Medications: I have reviewed the patient's current medications.  Current Outpatient Medications  Medication Sig Dispense Refill    amphetamine-dextroamphetamine (ADDERALL XR) 20 MG 24 hr capsule Take 1 capsule (20 mg total) by mouth 2 (two) times daily. 60 capsule 0   amphetamine-dextroamphetamine (ADDERALL XR) 20 MG 24 hr capsule Take 1 capsule (20 mg total) by mouth 2 (two) times daily. 60 capsule 0   amphetamine-dextroamphetamine (ADDERALL XR) 20 MG 24 hr capsule Take 1 capsule (20 mg total) by mouth 2 (two) times daily. 60 capsule 0   sertraline (ZOLOFT) 100 MG tablet Take 1 tablet (100 mg total) by mouth daily. 90 tablet 1   terbinafine (LAMISIL) 250 MG tablet Take 1 tablet (250 mg total) by mouth daily. 90 tablet 0   No current facility-administered medications for this visit.    Medication Side Effects: None  Allergies: No Known Allergies  Past Medical History:  Diagnosis Date   Anxiety    Thrombosis     Past Medical History, Surgical history, Social history, and Family history were reviewed and updated as appropriate.   Please see review of systems for further details on the patient's review from today.   Objective:   Physical Exam:  There were no vitals taken for this visit.  Physical Exam Constitutional:      General: She is not in acute distress. Musculoskeletal:        General: No deformity.  Neurological:     Mental Status: She is alert and oriented to person, place, and time.     Coordination: Coordination normal.  Psychiatric:        Attention and Perception: Attention and perception normal. She does not perceive auditory or visual hallucinations.  Mood and Affect: Mood normal. Mood is not anxious or depressed. Affect is not labile, blunt, angry or inappropriate.        Speech: Speech normal.        Behavior: Behavior normal.        Thought Content: Thought content normal. Thought content is not paranoid or delusional. Thought content does not include homicidal or suicidal ideation. Thought content does not include homicidal or suicidal plan.        Cognition and Memory: Cognition  and memory normal.        Judgment: Judgment normal.     Comments: Insight intact     Lab Review:     Component Value Date/Time   NA 142 05/31/2021 1145   K 4.4 05/31/2021 1145   CL 112 (H) 05/31/2021 1145   CO2 23 05/31/2021 1145   GLUCOSE 90 05/31/2021 1145   BUN 13 05/31/2021 1145   CREATININE 0.72 05/31/2021 1145   CALCIUM 9.3 05/31/2021 1145   PROT 6.9 06/03/2022 1130   PROT 6.2 07/19/2020 0942   ALBUMIN 3.8 07/19/2020 0942   AST 12 06/03/2022 1130   ALT 11 06/03/2022 1130   ALKPHOS 49 07/19/2020 0942   BILITOT 0.7 06/03/2022 1130   BILITOT <0.2 07/19/2020 0942   GFRNONAA >60 05/31/2021 1145       Component Value Date/Time   WBC 10.2 05/31/2021 1145   RBC 4.75 05/31/2021 1145   HGB 14.5 05/31/2021 1145   HCT 43.8 05/31/2021 1145   PLT 314 05/31/2021 1145   MCV 92.2 05/31/2021 1145   MCH 30.5 05/31/2021 1145   MCHC 33.1 05/31/2021 1145   RDW 13.3 05/31/2021 1145   LYMPHSABS 1.9 05/31/2021 1145   MONOABS 0.8 05/31/2021 1145   EOSABS 0.2 05/31/2021 1145   BASOSABS 0.0 05/31/2021 1145    No results found for: "POCLITH", "LITHIUM"   No results found for: "PHENYTOIN", "PHENOBARB", "VALPROATE", "CBMZ"   .res Assessment: Plan:    Plan:  PDMP reviewed  Zoloft 150mg  daily Adderall XR 20mg  BID daily - medication not lasting the duration of the work day.  Monitor BP between visits while taking stimulant medication.    [facility] ADD screening - 47/58 ADHD likely  RTC 3 months  Patient advised to contact office with any questions, adverse effects, or acute worsening in signs and symptoms.  Discussed potential benefits, risks, and side effects of stimulants with patient to include increased heart rate, palpitations, insomnia, increased anxiety, increased irritability, or decreased appetite.  Instructed patient to contact office if experiencing any significant tolerability issues.   There are no diagnoses linked to this encounter.   Please see After  Visit Summary for patient specific instructions.  No future appointments.  No orders of the defined types were placed in this encounter.   -------------------------------

## 2023-02-01 DIAGNOSIS — R400 Somnolence: Secondary | ICD-10-CM | POA: Diagnosis not present

## 2023-02-01 DIAGNOSIS — G4733 Obstructive sleep apnea (adult) (pediatric): Secondary | ICD-10-CM | POA: Diagnosis not present

## 2023-03-11 ENCOUNTER — Telehealth: Payer: Self-pay | Admitting: Adult Health

## 2023-03-11 NOTE — Telephone Encounter (Signed)
Kayla Butler called at 2:10 to indicated that at the last visit she mentioned she may need a letter of accommodation for work.  She is requesting that letter.  Call her when ready.  She will pick it up.

## 2023-03-11 NOTE — Telephone Encounter (Signed)
Could we set up an appointment to discuss specifics? Phone call?

## 2023-03-11 NOTE — Telephone Encounter (Signed)
Patient said she needs written instructions on how to complete tasks instead of verbal instructions.  She said she also needs a quiet space to complete her work. She said she has an office but someone is always coming in and she is having difficulty getting her work done.

## 2023-03-11 NOTE — Telephone Encounter (Signed)
Does she have any specific things the letter needs to address?

## 2023-03-11 NOTE — Telephone Encounter (Signed)
Pt called and needs to record meetings without consent from people to go back and listen to later. This is a part of the letter that she needs for accomadations for work. Please look at first note. this is an addition

## 2023-03-11 NOTE — Telephone Encounter (Signed)
Please schedule an earlier appt with Kayla Butler to discuss what she needs for letter.

## 2023-03-14 DIAGNOSIS — S99922A Unspecified injury of left foot, initial encounter: Secondary | ICD-10-CM | POA: Diagnosis not present

## 2023-03-17 ENCOUNTER — Ambulatory Visit (INDEPENDENT_AMBULATORY_CARE_PROVIDER_SITE_OTHER): Payer: BC Managed Care – PPO | Admitting: Adult Health

## 2023-03-17 ENCOUNTER — Encounter: Payer: Self-pay | Admitting: Adult Health

## 2023-03-17 DIAGNOSIS — F411 Generalized anxiety disorder: Secondary | ICD-10-CM | POA: Diagnosis not present

## 2023-03-17 DIAGNOSIS — F410 Panic disorder [episodic paroxysmal anxiety] without agoraphobia: Secondary | ICD-10-CM

## 2023-03-17 DIAGNOSIS — F902 Attention-deficit hyperactivity disorder, combined type: Secondary | ICD-10-CM

## 2023-03-17 DIAGNOSIS — G4700 Insomnia, unspecified: Secondary | ICD-10-CM

## 2023-03-17 DIAGNOSIS — M79605 Pain in left leg: Secondary | ICD-10-CM | POA: Diagnosis not present

## 2023-03-17 DIAGNOSIS — F428 Other obsessive-compulsive disorder: Secondary | ICD-10-CM

## 2023-03-17 MED ORDER — TRAZODONE HCL 50 MG PO TABS
50.0000 mg | ORAL_TABLET | Freq: Every day | ORAL | 5 refills | Status: DC
Start: 2023-03-17 — End: 2023-04-27

## 2023-03-17 MED ORDER — AMPHETAMINE-DEXTROAMPHET ER 20 MG PO CP24: 20.0000 mg | ORAL_CAPSULE | Freq: Two times a day (BID) | ORAL | 0 refills | Status: DC

## 2023-03-17 NOTE — Progress Notes (Signed)
Kayla Butler [id number] 02-12-1986 37 y.o.  Subjective:   Patient ID:  Kayla Butler is a 37 y.o. (DOB June 13, 1986) female.  Chief Complaint: No chief complaint on file.   HPI Kayla Butler presents to the office today for follow-up of ADHD, MDD, GAD and obsessional thoughts and acts.   Describes mood today as "not so good". Pleasant. Denies tearfulness. Mood symptoms - denies depression and anxiety. Reports irritability - "a little". Denies irrational thoughts. Reports obsessive thoughts. Reports worry, rumination, and over thinking. Reports financial stressors. Mood is lower. Stating "I'm hanging in there". Reports recent situational stressors. Reports she was recently terminated from her position. She also reports ongoing issues in the work setting prior to the termination. She has reached out to family for support. Feels like medications are helpful. Stable interest and motivation. Taking medications as prescribed.  Energy levels lower. Active, does not have a regular exercise routine.  Enjoys some usual interests and activities. Single. Lives alone with [place] pig and 2 cats "[name]". Mother and sister local. Brother in [place]. Spending time with family. Appetite adequate. Weight loss - 240 pounds. Reports recent sleep issues with losing her job. Diagnosed with mild sleep apnea - using CPAP machine. Focus and concentration difficulties at times..  Managing aspects of household. Works as a Producer, television/film/video - recently fired.  Denies SI or HI.  Denies AH or VH. Denies self harm. Denies substance use. Denies alcohol use.   Previous medication trials:  Zoloft,    Flowsheet Row ED from 07/27/2021 in [facility] Emergency Department at [facility] ED from 05/31/2021 in [facility] Emergency Department at [facility]  C-SSRS RISK CATEGORY No Risk No Risk        Review of Systems:  Review of Systems  Musculoskeletal:  Negative for gait problem.   Neurological:  Negative for tremors.  Psychiatric/Behavioral:         Please refer to HPI    Medications: I have reviewed the patient's current medications.  Current Outpatient Medications  Medication Sig Dispense Refill   traZODone (DESYREL) 50 MG tablet Take 1 tablet (50 mg total) by mouth at bedtime. 30 tablet 5   amphetamine-dextroamphetamine (ADDERALL XR) 20 MG 24 hr capsule Take 1 capsule (20 mg total) by mouth 2 (two) times daily. 60 capsule 0   amphetamine-dextroamphetamine (ADDERALL XR) 20 MG 24 hr capsule Take 1 capsule (20 mg total) by mouth 2 (two) times daily. 60 capsule 0   amphetamine-dextroamphetamine (ADDERALL XR) 20 MG 24 hr capsule Take 1 capsule (20 mg total) by mouth 2 (two) times daily. 60 capsule 0   sertraline (ZOLOFT) 100 MG tablet Take one and 1/2 tablets daily. 135 tablet 1   terbinafine (LAMISIL) 250 MG tablet Take 1 tablet (250 mg total) by mouth daily. 90 tablet 0   No current facility-administered medications for this visit.    Medication Side Effects: None  Allergies: No Known Allergies  Past Medical History:  Diagnosis Date   Anxiety    Thrombosis     Past Medical History, Surgical history, Social history, and Family history were reviewed and updated as appropriate.   Please see review of systems for further details on the patient's review from today.   Objective:   Physical Exam:  There were no vitals taken for this visit.  Physical Exam Constitutional:      General: She is not in acute distress. Musculoskeletal:        General: No deformity.  Neurological:     Mental Status: She is alert and oriented to person, place, and time.     Coordination: Coordination normal.  Psychiatric:        Attention and Perception: Attention and perception normal. She does not perceive auditory or visual hallucinations.        Mood and Affect: Mood normal. Mood is not anxious or depressed. Affect is not labile, blunt, angry or inappropriate.         Speech: Speech normal.        Behavior: Behavior normal.        Thought Content: Thought content normal. Thought content is not paranoid or delusional. Thought content does not include homicidal or suicidal ideation. Thought content does not include homicidal or suicidal plan.        Cognition and Memory: Cognition and memory normal.        Judgment: Judgment normal.     Comments: Insight intact     Lab Review:     Component Value Date/Time   NA 142 05/31/2021 1145   K 4.4 05/31/2021 1145   CL 112 (H) 05/31/2021 1145   CO2 23 05/31/2021 1145   GLUCOSE 90 05/31/2021 1145   BUN 13 05/31/2021 1145   CREATININE 0.72 05/31/2021 1145   CALCIUM 9.3 05/31/2021 1145   PROT 6.9 06/03/2022 1130   PROT 6.2 07/19/2020 0942   ALBUMIN 3.8 07/19/2020 0942   AST 12 06/03/2022 1130   ALT 11 06/03/2022 1130   ALKPHOS 49 07/19/2020 0942   BILITOT 0.7 06/03/2022 1130   BILITOT <0.2 07/19/2020 0942   GFRNONAA >60 05/31/2021 1145       Component Value Date/Time   WBC 10.2 05/31/2021 1145   RBC 4.75 05/31/2021 1145   HGB 14.5 05/31/2021 1145   HCT 43.8 05/31/2021 1145   PLT 314 05/31/2021 1145   MCV 92.2 05/31/2021 1145   MCH 30.5 05/31/2021 1145   MCHC 33.1 05/31/2021 1145   RDW 13.3 05/31/2021 1145   LYMPHSABS 1.9 05/31/2021 1145   MONOABS 0.8 05/31/2021 1145   EOSABS 0.2 05/31/2021 1145   BASOSABS 0.0 05/31/2021 1145    No results found for: "POCLITH", "LITHIUM"   No results found for: "PHENYTOIN", "PHENOBARB", "VALPROATE", "CBMZ"   .res Assessment: Plan:    Plan:  PDMP reviewed  Zoloft 150mg  daily Adderall XR 20mg  BID daily - medication not lasting the duration of the work day.  Add Trazadone 50mg  at hs for sleep  Monitor BP between visits while taking stimulant medication.    [facility] ADD screening - 47/58 ADHD likely  RTC 2 weeks  Patient advised to contact office with any questions, adverse effects, or acute worsening in signs and symptoms.  Discussed  potential benefits, risks, and side effects of stimulants with patient to include increased heart rate, palpitations, insomnia, increased anxiety, increased irritability, or decreased appetite.  Instructed patient to contact office if experiencing any significant tolerability issues.   Diagnoses and all orders for this visit:  Generalized anxiety disorder  Attention deficit hyperactivity disorder (ADHD), combined type -     amphetamine-dextroamphetamine (ADDERALL XR) 20 MG 24 hr capsule; Take 1 capsule (20 mg total) by mouth 2 (two) times daily.  Panic attacks  Obsessional thoughts  Insomnia, unspecified type -     traZODone (DESYREL) 50 MG tablet; Take 1 tablet (50 mg total) by mouth at bedtime.     Please see After Visit Summary for patient specific instructions.  Future Appointments  Date Time  Provider Department Center  04/27/2023  9:40 AM Silver Parkey, Thereasa Solo, NP CP-CP None    No orders of the defined types were placed in this encounter.   -------------------------------

## 2023-03-18 ENCOUNTER — Emergency Department (HOSPITAL_BASED_OUTPATIENT_CLINIC_OR_DEPARTMENT_OTHER): Payer: BC Managed Care – PPO

## 2023-03-18 ENCOUNTER — Encounter (HOSPITAL_BASED_OUTPATIENT_CLINIC_OR_DEPARTMENT_OTHER): Payer: Self-pay

## 2023-03-18 ENCOUNTER — Emergency Department (HOSPITAL_BASED_OUTPATIENT_CLINIC_OR_DEPARTMENT_OTHER)
Admission: EM | Admit: 2023-03-18 | Discharge: 2023-03-18 | Disposition: A | Discharge status: Home / Self Care | Payer: BC Managed Care – PPO | Attending: Emergency Medicine | Admitting: Emergency Medicine

## 2023-03-18 ENCOUNTER — Other Ambulatory Visit: Payer: Self-pay

## 2023-03-18 DIAGNOSIS — M79605 Pain in left leg: Secondary | ICD-10-CM | POA: Diagnosis not present

## 2023-03-18 DIAGNOSIS — I8002 Phlebitis and thrombophlebitis of superficial vessels of left lower extremity: Secondary | ICD-10-CM | POA: Diagnosis not present

## 2023-03-18 NOTE — ED Triage Notes (Signed)
Pt POV from home c/o LLE pain behind her knee. States hx of "superficial blood clot" in arm 05/2021. Denies blood thinners.

## 2023-03-18 NOTE — ED Notes (Signed)
Patient transported to [facility] 

## 2023-03-18 NOTE — Discharge Instructions (Addendum)
Please read and follow all provided instructions.  Your diagnoses today include:  1. Thrombophlebitis of superficial veins of left lower extremity    Tests performed today include: Vital signs. See below for your results today.   Medications prescribed:  None  Take any prescribed medications only as directed.  Additional Information:  Follow any educational materials contained in this packet.  Use your home diclofenac or other over-the-counter NSAID for pain control, use compression stockings, elevate your leg when able.  Follow-up instructions: Please follow-up with your primary care provider as needed for further evaluation of your symptoms.   Return instructions:  Please return to the Emergency Department if you experience worsening symptoms. Return immediately if you develop chest pain, shortness of breath, dizziness or fainting. Return with new color change or weakness/numbness to your affected leg(s). Return with bleeding, severe headache, confusion or altered mental status. Please return if you have any other emergent concerns.  Additional Information:  Your vital signs today were: BP (!) 152/86 (BP Location: Left Arm)   Pulse (!) 102   Temp 99 F (37.2 C)   Resp 20   Ht 5\' 6"  (1.676 m)   Wt 112.9 kg   LMP 03/11/2023 (Exact Date)   SpO2 100%   BMI 40.19 kg/m  If your blood pressure (BP) was elevated above 135/85 this visit, please have this repeated by your doctor within one month. --------------

## 2023-03-18 NOTE — ED Provider Notes (Signed)
[place] EMERGENCY DEPARTMENT AT [facility] Provider Note   CSN: [id number] Arrival date & time: 03/18/23  1035     History  Chief Complaint  Patient presents with   Leg Pain    Kayla Butler is a 37 y.o. female.  Patient with a history of 2x provoked superficial thrombophlebitis (2022) presents to the ED with left leg tenderness for 1 day.  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  Patient denies taking aspirin or using compression stockings.  Patient states she has 2/10 tenderness on medial aspect of popliteal area and denies calf pain, swelling, chest pain or SOB.  Patient denies history of cancer, recent hospitalization, injury, DVT/PE, travel or birth control use.      Home Medications Prior to Admission medications   Medication Sig Start Date End Date Taking? Authorizing Provider  amphetamine-dextroamphetamine (ADDERALL XR) 20 MG 24 hr capsule Take 1 capsule (20 mg total) by mouth 2 (two) times daily. 02/17/23   Mozingo, Thereasa Solo, NP  amphetamine-dextroamphetamine (ADDERALL XR) 20 MG 24 hr capsule Take 1 capsule (20 mg total) by mouth 2 (two) times daily. 03/17/23   Mozingo, Thereasa Solo, NP  amphetamine-dextroamphetamine (ADDERALL XR) 20 MG 24 hr capsule Take 1 capsule (20 mg total) by mouth 2 (two) times daily. 03/17/23   Mozingo, Thereasa Solo, NP  sertraline (ZOLOFT) 100 MG tablet Take one and 1/2 tablets daily. 01/20/23   Mozingo, Thereasa Solo, NP  terbinafine (LAMISIL) 250 MG tablet Take 1 tablet (250 mg total) by mouth daily. 06/04/22   Candelaria Stagers, DPM  traZODone (DESYREL) 50 MG tablet Take 1 tablet (50 mg total) by mouth at bedtime. 03/17/23   Mozingo, Thereasa Solo, NP      Allergies    Patient has no known allergies.    Review of Systems   Review of Systems  Physical Exam Updated Vital Signs BP (!) 152/86 (BP Location: Left Arm)   Pulse (!) 102   Temp 99 F (37.2 C)   Resp 20   Ht 5\' 6"  (1.676 m)   Wt  112.9 kg   LMP 03/11/2023 (Exact Date)   SpO2 100%   BMI 40.19 kg/m   Physical Exam Vitals and nursing note reviewed.  Constitutional:      Appearance: She is well-developed.  HENT:     Head: Normocephalic and atraumatic.  Eyes:     Conjunctiva/sclera: Conjunctivae normal.  Pulmonary:     Effort: No respiratory distress.  Musculoskeletal:     Cervical back: Normal range of motion and neck supple.     Comments: L knee with palpable cord L popliteal fossa with mild tenderness, normal no overlying erythema.  Skin:    General: Skin is warm and dry.  Neurological:     Mental Status: She is alert.     ED Results / Procedures / Treatments   Labs (all labs ordered are listed, but only abnormal results are displayed) Labs Reviewed - No data to display  EKG None  Radiology US Venous Img Lower  Left (DVT Study)  Result Date: 03/18/2023 CLINICAL DATA:  Posterior knee pain EXAM: Left LOWER EXTREMITY VENOUS DOPPLER ULTRASOUND TECHNIQUE: Gray-scale sonography with compression, as well as color and duplex ultrasound, were performed to evaluate the deep venous system(s) from the level of the common femoral vein through the popliteal and proximal calf veins. COMPARISON:  None Available. FINDINGS: VENOUS Normal compressibility of the common femoral, superficial femoral, and popliteal veins, as  well as the visualized calf veins. Visualized portions of profunda femoral vein and great saphenous vein unremarkable. No filling defects to suggest DVT on grayscale or color Doppler imaging. Doppler waveforms show normal direction of venous flow, normal respiratory plasticity and response to augmentation. Exception is small amount of thrombus with poor flow and compression involving a superficial vein at the level of the medial aspect of the knee. Limited views of the contralateral common femoral vein are unremarkable. OTHER None. Limitations: none IMPRESSION: No evidence of left lower extremity DVT.  Superficial venous thrombus within a vein medial to the left knee Electronically Signed   By: Karen Kays M.D.   On: 03/18/2023 12:00    Procedures Procedures    Medications Ordered in ED Medications - No data to display  ED Course/ Medical Decision Making/ A&P    Patient seen and examined. History obtained directly from patient.   Labs/EKG: None ordered  Imaging: Ordered DVT study  Medications/Fluids: None  Most recent vital signs reviewed and are as follows: BP (!) 152/86 (BP Location: Left Arm)   Pulse (!) 102   Temp 99 F (37.2 C)   Resp 20   Ht 5\' 6"  (1.676 m)   Wt 112.9 kg   LMP 03/11/2023 (Exact Date)   SpO2 100%   BMI 40.19 kg/m   Initial impression: Likely superficial thrombophlebitis  12:12 PM Reassessment performed. Patient appears stable.  Imaging results reviewed: Work demonstrates superficial thrombophlebitis clinically consistent with exam.  Reviewed pertinent lab work and imaging with patient at bedside. Questions answered.   Most current vital signs reviewed and are as follows: BP (!) 152/86 (BP Location: Left Arm)   Pulse (!) 102   Temp 99 F (37.2 C)   Resp 20   Ht 5\' 6"  (1.676 m)   Wt 112.9 kg   LMP 03/11/2023 (Exact Date)   SpO2 100%   BMI 40.19 kg/m   Plan: Discharge to home.   Prescriptions written for: None, patient has home diclofenac  Other home care instructions discussed: Compression stockings, leg elevation  ED return instructions discussed: Worsening pain, redness, swelling, shortness of breath, new symptoms or other concerns  Follow-up instructions discussed: Patient encouraged to follow-up with their PCP as needed.                              Medical Decision Making  Patient with left lower extremity superficial thrombophlebitis.  She has a history of similar.  No DVT.  No concern for PE.  No concern for cellulitis or abscess based on exam.  Lower extremity appears vascularly intact.        Final Clinical  Impression(s) / ED Diagnoses Final diagnoses:  Thrombophlebitis of superficial veins of left lower extremity    Rx / DC Orders ED Discharge Orders     None         Renne Crigler, PA-C 03/18/23 1214    Derwood Kaplan, MD 03/18/23 1512

## 2023-04-27 ENCOUNTER — Encounter: Payer: Self-pay | Admitting: Adult Health

## 2023-04-27 ENCOUNTER — Ambulatory Visit (INDEPENDENT_AMBULATORY_CARE_PROVIDER_SITE_OTHER): Payer: BLUE CROSS/BLUE SHIELD | Admitting: Adult Health

## 2023-04-27 DIAGNOSIS — F410 Panic disorder [episodic paroxysmal anxiety] without agoraphobia: Secondary | ICD-10-CM

## 2023-04-27 DIAGNOSIS — F428 Other obsessive-compulsive disorder: Secondary | ICD-10-CM | POA: Diagnosis not present

## 2023-04-27 DIAGNOSIS — F411 Generalized anxiety disorder: Secondary | ICD-10-CM | POA: Diagnosis not present

## 2023-04-27 DIAGNOSIS — G4700 Insomnia, unspecified: Secondary | ICD-10-CM

## 2023-04-27 DIAGNOSIS — F902 Attention-deficit hyperactivity disorder, combined type: Secondary | ICD-10-CM | POA: Diagnosis not present

## 2023-04-27 MED ORDER — LISDEXAMFETAMINE DIMESYLATE 40 MG PO CAPS
40.0000 mg | ORAL_CAPSULE | ORAL | 0 refills | Status: DC
Start: 2023-04-27 — End: 2023-08-25

## 2023-04-27 NOTE — Progress Notes (Signed)
Kayla Butler [id number] 18-Jan-1986 37 y.o.  Subjective:   Patient ID:  Kayla Butler is a 37 y.o. (DOB 01-14-1986) female.  Chief Complaint: No chief complaint on file.   HPI DELL DEBS presents to the office today for follow-up of ADHD, MDD, GAD and obsessional thoughts and acts.   Describes mood today as "ok". Pleasant. Denies tearfulness. Mood symptoms - denies depression and irritability. Reports  anxiety. Denies irrational thoughts. Denies obsessive thoughts. Reports worry, rumination, and over thinking. Reports financial stressors - mother and family supportive. Reports situational stressors. Mood is "getting better". Feels like medications are helpful. Stable interest and motivation. Taking medications as prescribed.  Energy levels improving. Active, does not have a regular exercise routine.  Enjoys some usual interests and activities. Single. Lives alone with [place] pig and 2 cats "[name]". Mother and sister local. Brother in [place]. Spending time with family. Appetite adequate. Weight gain - 240 pounds to 255 pounds. Sleep has improved. Averages 8 hours - using CPAP machine. Focus and concentration difficulties at times. Managing aspects of household. 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 Denies SI or HI.  Denies AH or VH. Denies self harm. Denies substance use. Denies alcohol use.   Previous medication trials:  Zoloft,     Flowsheet Row ED from 03/18/2023 in [facility] Emergency Department at [facility] ED from 07/27/2021 in [facility] Emergency Department at [facility] ED from 05/31/2021 in [facility] Emergency Department at [facility]  C-SSRS RISK CATEGORY No Risk No Risk No Risk        Review of Systems:  Review of Systems  Musculoskeletal:  Negative for gait problem.  Neurological:  Negative for tremors.  Psychiatric/Behavioral:          Please refer to HPI    Medications: I have reviewed the patient's current medications.  Current Outpatient Medications  Medication Sig Dispense Refill   lisdexamfetamine (VYVANSE) 40 MG capsule Take 1 capsule (40 mg total) by mouth every morning. 30 capsule 0   amphetamine-dextroamphetamine (ADDERALL XR) 20 MG 24 hr capsule Take 1 capsule (20 mg total) by mouth 2 (two) times daily. 60 capsule 0   amphetamine-dextroamphetamine (ADDERALL XR) 20 MG 24 hr capsule Take 1 capsule (20 mg total) by mouth 2 (two) times daily. 60 capsule 0   amphetamine-dextroamphetamine (ADDERALL XR) 20 MG 24 hr capsule Take 1 capsule (20 mg total) by mouth 2 (two) times daily. 60 capsule 0   sertraline (ZOLOFT) 100 MG tablet Take one and 1/2 tablets daily. 135 tablet 1   terbinafine (LAMISIL) 250 MG tablet Take 1 tablet (250 mg total) by mouth daily. 90 tablet 0   No current facility-administered medications for this visit.    Medication Side Effects: None  Allergies: No Known Allergies  Past Medical History:  Diagnosis Date   Anxiety    Thrombosis     Past Medical History, Surgical history, Social history, and Family history were reviewed and updated as appropriate.   Please see review of systems for further details on the patient's review from today.   Objective:   Physical Exam:  There were no vitals taken for this visit.  Physical Exam Constitutional:      General: She is not in acute distress. Musculoskeletal:        General: No deformity.  Neurological:     Mental Status: She is alert and oriented  to person, place, and time.     Coordination: Coordination normal.  Psychiatric:        Attention and Perception: Attention and perception normal. She does not perceive auditory or visual hallucinations.        Mood and Affect: Mood normal. Mood is not anxious or depressed. Affect is not labile, blunt, angry or inappropriate.        Speech: Speech normal.        Behavior: Behavior normal.         Thought Content: Thought content normal. Thought content is not paranoid or delusional. Thought content does not include homicidal or suicidal ideation. Thought content does not include homicidal or suicidal plan.        Cognition and Memory: Cognition and memory normal.        Judgment: Judgment normal.     Comments: Insight intact     Lab Review:     Component Value Date/Time   NA 142 05/31/2021 1145   K 4.4 05/31/2021 1145   CL 112 (H) 05/31/2021 1145   CO2 23 05/31/2021 1145   GLUCOSE 90 05/31/2021 1145   BUN 13 05/31/2021 1145   CREATININE 0.72 05/31/2021 1145   CALCIUM 9.3 05/31/2021 1145   PROT 6.9 06/03/2022 1130   PROT 6.2 07/19/2020 0942   ALBUMIN 3.8 07/19/2020 0942   AST 12 06/03/2022 1130   ALT 11 06/03/2022 1130   ALKPHOS 49 07/19/2020 0942   BILITOT 0.7 06/03/2022 1130   BILITOT <0.2 07/19/2020 0942   GFRNONAA >60 05/31/2021 1145       Component Value Date/Time   WBC 10.2 05/31/2021 1145   RBC 4.75 05/31/2021 1145   HGB 14.5 05/31/2021 1145   HCT 43.8 05/31/2021 1145   PLT 314 05/31/2021 1145   MCV 92.2 05/31/2021 1145   MCH 30.5 05/31/2021 1145   MCHC 33.1 05/31/2021 1145   RDW 13.3 05/31/2021 1145   LYMPHSABS 1.9 05/31/2021 1145   MONOABS 0.8 05/31/2021 1145   EOSABS 0.2 05/31/2021 1145   BASOSABS 0.0 05/31/2021 1145    No results found for: "POCLITH", "LITHIUM"   No results found for: "PHENYTOIN", "PHENOBARB", "VALPROATE", "CBMZ"   .res Assessment: Plan:    Plan:  PDMP reviewed  Zoloft 150mg  daily  D/C Adderall XR 20mg  BID daily  Add Vyvanse 40mg  daily   D/C Trazadone 50mg  at hs for sleep  Monitor BP between visits while taking stimulant medication.    [facility] ADD screening - 47/58 ADHD likely  RTC 2 weeks  Patient advised to contact office with any questions, adverse effects, or acute worsening in signs and symptoms.  Discussed potential benefits, risks, and side effects of stimulants with patient to include increased  heart rate, palpitations, insomnia, increased anxiety, increased irritability, or decreased appetite.  Instructed patient to contact office if experiencing any significant tolerability issues.   Diagnoses and all orders for this visit:  Attention deficit hyperactivity disorder (ADHD), combined type -     lisdexamfetamine (VYVANSE) 40 MG capsule; Take 1 capsule (40 mg total) by mouth every morning.  Generalized anxiety disorder  Panic attacks  Obsessional thoughts  Insomnia, unspecified type     Please see After Visit Summary for patient specific instructions.  No future appointments.   No orders of the defined types were placed in this encounter.   -------------------------------

## 2023-04-30 ENCOUNTER — Telehealth: Payer: Self-pay | Admitting: Adult Health

## 2023-04-30 NOTE — Telephone Encounter (Signed)
Patient lvm at 11:20 stating that she needs a letter or some kind of document saying that she requested letter of accomodation from a phone call that was made when she asked for the letter. She was terminated from her job the next day after she requested the letter. Pls rtc [phone]

## 2023-05-04 NOTE — Telephone Encounter (Signed)
Letter written. Patient notified it was available to pick up.

## 2023-05-10 ENCOUNTER — Other Ambulatory Visit: Payer: Self-pay

## 2023-05-10 ENCOUNTER — Telehealth: Payer: Self-pay | Admitting: Adult Health

## 2023-05-10 DIAGNOSIS — F902 Attention-deficit hyperactivity disorder, combined type: Secondary | ICD-10-CM

## 2023-05-10 MED ORDER — AMPHETAMINE-DEXTROAMPHET ER 20 MG PO CP24
20.0000 mg | ORAL_CAPSULE | Freq: Two times a day (BID) | ORAL | 0 refills | Status: DC
Start: 2023-05-10 — End: 2023-06-11

## 2023-05-10 NOTE — Telephone Encounter (Signed)
Next visit is 06/11/23. Requesting refill on Adderall 20 mg called to:  [id number] [place], [place] - [address]   Phone: [phone]  Fax: [phone]    Its in stock

## 2023-05-10 NOTE — Telephone Encounter (Signed)
Pended. Had been switched to Vyvanse last visit, but reports cost is too much.

## 2023-06-11 ENCOUNTER — Ambulatory Visit (INDEPENDENT_AMBULATORY_CARE_PROVIDER_SITE_OTHER): Payer: BLUE CROSS/BLUE SHIELD | Admitting: Adult Health

## 2023-06-11 ENCOUNTER — Telehealth: Payer: Self-pay | Admitting: Adult Health

## 2023-06-11 ENCOUNTER — Encounter: Payer: Self-pay | Admitting: Adult Health

## 2023-06-11 DIAGNOSIS — F411 Generalized anxiety disorder: Secondary | ICD-10-CM | POA: Diagnosis not present

## 2023-06-11 DIAGNOSIS — F410 Panic disorder [episodic paroxysmal anxiety] without agoraphobia: Secondary | ICD-10-CM

## 2023-06-11 DIAGNOSIS — F428 Other obsessive-compulsive disorder: Secondary | ICD-10-CM

## 2023-06-11 DIAGNOSIS — F902 Attention-deficit hyperactivity disorder, combined type: Secondary | ICD-10-CM

## 2023-06-11 MED ORDER — AMPHETAMINE-DEXTROAMPHET ER 20 MG PO CP24
20.0000 mg | ORAL_CAPSULE | Freq: Two times a day (BID) | ORAL | 0 refills | Status: DC
Start: 2023-06-11 — End: 2023-08-25

## 2023-06-11 NOTE — Progress Notes (Signed)
Kayla Butler [id number] [birth year] 37 y.o.  Subjective:   Patient ID:  Kayla Butler is a 37 y.o. (DOB 1986/02/01) female.  Chief Complaint: No chief complaint on file.   HPI IZELA HOUGHTON presents to the office today for follow-up of ADHD, MDD, GAD and obsessional thoughts and acts.   Describes mood today as "ok". Pleasant. Denies tearfulness. Mood symptoms - reports some depression - "lingering". Denies anxiety and irritability. Denies irrational thoughts. Denies obsessive thoughts. Reports some worry, rumination, and over thinking. Reports financial stressors - mother and family supportive. 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 Mood is "improving". Stating "I feel like I'm doing better". Feels like medications are helpful. Stable interest and motivation. Taking medications as prescribed.  Energy levels improved. Active, has a regular exercise routine.  Enjoys some usual interests and activities. Single. Lives alone with [place] pig "Rupert"and 2 cats "[name]". Mother and sister local. Brother in [place]. Spending time with family. Appetite adequate. Weight loss.  Sleep has improved. Averages 8 hours - using CPAP machine. Focus and concentration improved. Managing aspects of household. 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  Denies SI or HI.  Denies AH or VH. Denies self harm. Denies substance use. Denies alcohol use.   Previous medication trials:  Zoloft,     Flowsheet Row ED from 03/18/2023 in [facility] Emergency Department at [facility] ED from 07/27/2021 in [facility] Emergency Department at [facility] ED from 05/31/2021 in [facility] Emergency Department at [facility]  C-SSRS RISK CATEGORY No Risk No Risk No Risk        Review of Systems:  Review of Systems  Musculoskeletal:  Negative for gait problem.  Neurological:  Negative for tremors.  Psychiatric/Behavioral:          Please refer to HPI    Medications: I have reviewed the patient's current medications.  Current Outpatient Medications  Medication Sig Dispense Refill   amphetamine-dextroamphetamine (ADDERALL XR) 20 MG 24 hr capsule Take 1 capsule (20 mg total) by mouth 2 (two) times daily. 60 capsule 0   amphetamine-dextroamphetamine (ADDERALL XR) 20 MG 24 hr capsule Take 1 capsule (20 mg total) by mouth 2 (two) times daily. 60 capsule 0   amphetamine-dextroamphetamine (ADDERALL XR) 20 MG 24 hr capsule Take 1 capsule (20 mg total) by mouth 2 (two) times daily. 60 capsule 0   lisdexamfetamine (VYVANSE) 40 MG capsule Take 1 capsule (40 mg total) by mouth every morning. 30 capsule 0   sertraline (ZOLOFT) 100 MG tablet Take one and 1/2 tablets daily. 135 tablet 1   terbinafine (LAMISIL) 250 MG tablet Take 1 tablet (250 mg total) by mouth daily. 90 tablet 0   No current facility-administered medications for this visit.    Medication Side Effects: None  Allergies: No Known Allergies  Past Medical History:  Diagnosis Date   Anxiety    Thrombosis     Past Medical History, Surgical history, Social history, and Family history were reviewed and updated as appropriate.   Please see review of systems for further details on the patient's review from today.   Objective:   Physical Exam:  There were no vitals taken for this visit.  Physical Exam Constitutional:      General: She is not in acute distress. Musculoskeletal:        General: No deformity.  Neurological:     Mental Status: She is alert  and oriented to person, place, and time.     Coordination: Coordination normal.  Psychiatric:        Attention and Perception: Attention and perception normal. She does not perceive auditory or visual hallucinations.        Mood and Affect: Affect is not labile, blunt, angry or inappropriate.        Speech: Speech normal.        Behavior: Behavior normal.        Thought Content: Thought content normal.  Thought content is not paranoid or delusional. Thought content does not include homicidal or suicidal ideation. Thought content does not include homicidal or suicidal plan.        Cognition and Memory: Cognition and memory normal.        Judgment: Judgment normal.     Comments: Insight intact     Lab Review:     Component Value Date/Time   NA 142 05/31/2021 1145   K 4.4 05/31/2021 1145   CL 112 (H) 05/31/2021 1145   CO2 23 05/31/2021 1145   GLUCOSE 90 05/31/2021 1145   BUN 13 05/31/2021 1145   CREATININE 0.72 05/31/2021 1145   CALCIUM 9.3 05/31/2021 1145   PROT 6.9 06/03/2022 1130   PROT 6.2 07/19/2020 0942   ALBUMIN 3.8 07/19/2020 0942   AST 12 06/03/2022 1130   ALT 11 06/03/2022 1130   ALKPHOS 49 07/19/2020 0942   BILITOT 0.7 06/03/2022 1130   BILITOT <0.2 07/19/2020 0942   GFRNONAA >60 05/31/2021 1145       Component Value Date/Time   WBC 10.2 05/31/2021 1145   RBC 4.75 05/31/2021 1145   HGB 14.5 05/31/2021 1145   HCT 43.8 05/31/2021 1145   PLT 314 05/31/2021 1145   MCV 92.2 05/31/2021 1145   MCH 30.5 05/31/2021 1145   MCHC 33.1 05/31/2021 1145   RDW 13.3 05/31/2021 1145   LYMPHSABS 1.9 05/31/2021 1145   MONOABS 0.8 05/31/2021 1145   EOSABS 0.2 05/31/2021 1145   BASOSABS 0.0 05/31/2021 1145    No results found for: "POCLITH", "LITHIUM"   No results found for: "PHENYTOIN", "PHENOBARB", "VALPROATE", "CBMZ"   .res Assessment: Plan:    Plan:  PDMP reviewed  Zoloft 150mg  daily  Adderall XR 20mg  BID daily  Add Vyvanse 40mg  daily - has not been able to get for insurance reasons  Monitor BP between visits while taking stimulant medication.    [facility] ADD screening - 47/58 ADHD likely  RTC 2 weeks  Patient advised to contact office with any questions, adverse effects, or acute worsening in signs and symptoms.  Discussed potential benefits, risks, and side effects of stimulants with patient to include increased heart rate, palpitations, insomnia,  increased anxiety, increased irritability, or decreased appetite.  Instructed patient to contact office if experiencing any significant tolerability issues.   There are no diagnoses linked to this encounter.   Please see After Visit Summary for patient specific instructions.  Future Appointments  Date Time Provider Department Center  06/11/2023  8:40 AM Marnita Poirier, Thereasa Solo, NP CP-CP None    No orders of the defined types were placed in this encounter.   -------------------------------

## 2023-06-11 NOTE — Telephone Encounter (Signed)
Patient is unable to find Adderall 20 XR. It is on backorder, per [facility] is to be shipped mid September. She is asking for an alternative. She is also prescribed Vyvanse, but due to insurance reasons can't get it per your note. She was on 30 XR in August and Sept 2023 because she reported the 20 mg was wearing off.

## 2023-06-11 NOTE — Telephone Encounter (Signed)
Patient lvm at 1:40 stating that she has called pharmacies within a 50 miles radius and none have Amphetamine in stock. She inquired if something else could be sent in or should she wait until its back in stock. Ph: [phone]

## 2023-06-11 NOTE — Telephone Encounter (Signed)
Pt lvm that pharmacy has instant release adderall in stock

## 2023-06-14 ENCOUNTER — Other Ambulatory Visit: Payer: Self-pay

## 2023-06-14 MED ORDER — AMPHETAMINE-DEXTROAMPHETAMINE 20 MG PO TABS
20.0000 mg | ORAL_TABLET | Freq: Every day | ORAL | 0 refills | Status: DC
Start: 2023-06-14 — End: 2023-10-25

## 2023-06-14 NOTE — Telephone Encounter (Signed)
Patient is on Adderall 20 XR BID which has been on backorder. Patient is asking to get Rx for 20 mg IR BID. ? Ok to pend.

## 2023-06-14 NOTE — Telephone Encounter (Signed)
[place] called back and LM at 11:50 to find out if a new prescription of the Adderall from instant release will be sent in or will she just have to wait her dose comes back instock.  Please call t advise.

## 2023-06-14 NOTE — Telephone Encounter (Signed)
Please call to schedule FU. Was seen 9/20 with RTC in 2 weeks.

## 2023-06-17 NOTE — Telephone Encounter (Signed)
Please call patient to verify what dose she wants sent and what pharmacy.  This was from a week or more ago. She needed an appt and that has been scheduled. Not sure if the information is still accurate.   Patient is on Adderall 20 XR BID which has been on backorder. Patient is asking to get Rx for 20 mg IR BID

## 2023-06-17 NOTE — Telephone Encounter (Signed)
Pt is scheduled 10/07

## 2023-06-17 NOTE — Telephone Encounter (Signed)
Spoke with pt, she is taking  Adderall 20 mg xr bid;  [facility] [address] [place], [place] [address]

## 2023-06-17 NOTE — Telephone Encounter (Signed)
Please call pharmacy and see if she has a RF available. I see one, but they didn't have in stock last week and she wanted a different dose sent to a different pharmacy. I don't know if  I canceled that Rx. If it is in stock ask them to fill it and notify patient of Rx.  If they don't have Rx please let me know and I'll send in.

## 2023-06-18 ENCOUNTER — Other Ambulatory Visit: Payer: Self-pay

## 2023-06-18 NOTE — Telephone Encounter (Signed)
LVM AT PHARMACY.

## 2023-06-18 NOTE — Telephone Encounter (Signed)
LVM TO RC

## 2023-06-18 NOTE — Telephone Encounter (Signed)
Patient's 20 mg XR dose is still on backorder, but she has a script available for pickup of 20 mg. Please call and let her know.

## 2023-06-28 ENCOUNTER — Ambulatory Visit: Payer: BLUE CROSS/BLUE SHIELD | Admitting: Adult Health

## 2023-08-11 ENCOUNTER — Other Ambulatory Visit: Payer: Self-pay

## 2023-08-11 ENCOUNTER — Telehealth: Payer: Self-pay | Admitting: Adult Health

## 2023-08-11 NOTE — Telephone Encounter (Signed)
Sent MyChart message to schedule FU. Last seen 10/20 with RTC in 2 weeks.

## 2023-08-11 NOTE — Telephone Encounter (Signed)
Pt called at 1:21p requesting refill of Adderall XR 20mg .  (She  confirmed she wants XR).  [facility] to  [id number] [place], [facility] - [address] [place], [place] [address] Phone: [phone]  Fax: [phone]   No upcoming appt scheduled.

## 2023-08-13 NOTE — Telephone Encounter (Signed)
Please call to schedule FU. She did not respond to [employer]. Last seen 10/20 with RTC in 2 weeks.

## 2023-08-16 ENCOUNTER — Other Ambulatory Visit: Payer: Self-pay

## 2023-08-16 DIAGNOSIS — F902 Attention-deficit hyperactivity disorder, combined type: Secondary | ICD-10-CM

## 2023-08-16 MED ORDER — AMPHETAMINE-DEXTROAMPHET ER 20 MG PO CP24
20.0000 mg | ORAL_CAPSULE | Freq: Two times a day (BID) | ORAL | 0 refills | Status: DC
Start: 2023-08-16 — End: 2023-08-25

## 2023-08-16 NOTE — Telephone Encounter (Signed)
Pended Adderall 20 XR for 9 days to cover until appt - [facility] [address]

## 2023-08-16 NOTE — Telephone Encounter (Signed)
Pt has appt 1/24

## 2023-08-25 ENCOUNTER — Ambulatory Visit (INDEPENDENT_AMBULATORY_CARE_PROVIDER_SITE_OTHER): Payer: Medicaid Other | Admitting: Adult Health

## 2023-08-25 ENCOUNTER — Encounter: Payer: Self-pay | Admitting: Adult Health

## 2023-08-25 DIAGNOSIS — F410 Panic disorder [episodic paroxysmal anxiety] without agoraphobia: Secondary | ICD-10-CM

## 2023-08-25 DIAGNOSIS — F411 Generalized anxiety disorder: Secondary | ICD-10-CM | POA: Diagnosis not present

## 2023-08-25 DIAGNOSIS — F902 Attention-deficit hyperactivity disorder, combined type: Secondary | ICD-10-CM

## 2023-08-25 DIAGNOSIS — F428 Other obsessive-compulsive disorder: Secondary | ICD-10-CM

## 2023-08-25 MED ORDER — AMPHETAMINE-DEXTROAMPHET ER 20 MG PO CP24
20.0000 mg | ORAL_CAPSULE | Freq: Two times a day (BID) | ORAL | 0 refills | Status: DC
Start: 2023-08-25 — End: 2023-10-25

## 2023-08-25 MED ORDER — AMPHETAMINE-DEXTROAMPHET ER 20 MG PO CP24
20.0000 mg | ORAL_CAPSULE | Freq: Two times a day (BID) | ORAL | 0 refills | Status: DC
Start: 2023-10-20 — End: 2023-10-25

## 2023-08-25 MED ORDER — SERTRALINE HCL 100 MG PO TABS: ORAL_TABLET | ORAL | 1 refills | Status: DC

## 2023-08-25 MED ORDER — AMPHETAMINE-DEXTROAMPHET ER 20 MG PO CP24
20.0000 mg | ORAL_CAPSULE | Freq: Two times a day (BID) | ORAL | 0 refills | Status: DC
Start: 2023-09-22 — End: 2023-10-25

## 2023-08-25 NOTE — Progress Notes (Signed)
Kayla Butler [id number] 1986-01-03 37 y.o.  Subjective:   Patient ID:  Kayla Butler is a 37 y.o. (DOB Jan 28, 1986) female.  Chief Complaint: No chief complaint on file.   HPI Kayla Butler presents to the office today for follow-up of ADHD, MDD, GAD and obsessional thoughts and acts.   Describes mood today as "ok". Pleasant. Denies tearfulness. Mood symptoms - denies depression and irritability. Reports some anxiety - situational. Denies panic attacks. Denies obsessive and irrational thoughts.  Denies worry, rumination, and over thinking. Reports financial stressors - family supportive. Started her new job and feels it is going well. Mood is stable. Stating "I feel like I'm doing ok". Feels like medications are helpful. Stable interest and motivation. Taking medications as prescribed.  Energy levels improved. Active, has a regular exercise routine.  Enjoys some usual interests and activities. Single. Dating. Lives alone with 2 cats "[name]". Mother and sister local. Brother in [place]. Spending time with family. Appetite adequate. Weight loss.  Sleep has improved. Averages 8 hours - using CPAP machine. Focus and concentration stable. Managing aspects of household. 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  Denies SI or HI.  Denies AH or VH. Denies self harm. Denies substance use. Denies alcohol use.   Previous medication trials:  Zoloft,    Flowsheet Row ED from 03/18/2023 in [facility] Emergency Department at [facility] ED from 07/27/2021 in [facility] Emergency Department at [facility] ED from 05/31/2021 in [facility] Emergency Department at [facility]  C-SSRS RISK CATEGORY No Risk No Risk No Risk        Review of Systems:  Review of Systems  Musculoskeletal:  Negative for gait problem.  Neurological:  Negative for tremors.  Psychiatric/Behavioral:         Please refer to HPI    Medications: I have  reviewed the patient's current medications.  Current Outpatient Medications  Medication Sig Dispense Refill   amphetamine-dextroamphetamine (ADDERALL XR) 20 MG 24 hr capsule Take 1 capsule (20 mg total) by mouth 2 (two) times daily. 60 capsule 0   amphetamine-dextroamphetamine (ADDERALL XR) 20 MG 24 hr capsule Take 1 capsule (20 mg total) by mouth 2 (two) times daily. 60 capsule 0   amphetamine-dextroamphetamine (ADDERALL XR) 20 MG 24 hr capsule Take 1 capsule (20 mg total) by mouth 2 (two) times daily. 18 capsule 0   amphetamine-dextroamphetamine (ADDERALL) 20 MG tablet Take 1 tablet (20 mg total) by mouth daily. 60 tablet 0   lisdexamfetamine (VYVANSE) 40 MG capsule Take 1 capsule (40 mg total) by mouth every morning. 30 capsule 0   sertraline (ZOLOFT) 100 MG tablet Take one and 1/2 tablets daily. 135 tablet 1   terbinafine (LAMISIL) 250 MG tablet Take 1 tablet (250 mg total) by mouth daily. 90 tablet 0   No current facility-administered medications for this visit.    Medication Side Effects: None  Allergies: No Known Allergies  Past Medical History:  Diagnosis Date   Anxiety    Thrombosis     Past Medical History, Surgical history, Social history, and Family history were reviewed and updated as appropriate.   Please see review of systems for further details on the patient's review from today.   Objective:   Physical Exam:  There were no vitals taken for this visit.  Physical Exam Constitutional:      General: She is not in acute distress. Musculoskeletal:        General: No  deformity.  Neurological:     Mental Status: She is alert and oriented to person, place, and time.     Coordination: Coordination normal.  Psychiatric:        Attention and Perception: Attention and perception normal. She does not perceive auditory or visual hallucinations.        Mood and Affect: Affect is not labile, blunt, angry or inappropriate.        Speech: Speech normal.        Behavior:  Behavior normal.        Thought Content: Thought content normal. Thought content is not paranoid or delusional. Thought content does not include homicidal or suicidal ideation. Thought content does not include homicidal or suicidal plan.        Cognition and Memory: Cognition and memory normal.        Judgment: Judgment normal.     Comments: Insight intact     Lab Review:     Component Value Date/Time   NA 142 05/31/2021 1145   K 4.4 05/31/2021 1145   CL 112 (H) 05/31/2021 1145   CO2 23 05/31/2021 1145   GLUCOSE 90 05/31/2021 1145   BUN 13 05/31/2021 1145   CREATININE 0.72 05/31/2021 1145   CALCIUM 9.3 05/31/2021 1145   PROT 6.9 06/03/2022 1130   PROT 6.2 07/19/2020 0942   ALBUMIN 3.8 07/19/2020 0942   AST 12 06/03/2022 1130   ALT 11 06/03/2022 1130   ALKPHOS 49 07/19/2020 0942   BILITOT 0.7 06/03/2022 1130   BILITOT <0.2 07/19/2020 0942   GFRNONAA >60 05/31/2021 1145       Component Value Date/Time   WBC 10.2 05/31/2021 1145   RBC 4.75 05/31/2021 1145   HGB 14.5 05/31/2021 1145   HCT 43.8 05/31/2021 1145   PLT 314 05/31/2021 1145   MCV 92.2 05/31/2021 1145   MCH 30.5 05/31/2021 1145   MCHC 33.1 05/31/2021 1145   RDW 13.3 05/31/2021 1145   LYMPHSABS 1.9 05/31/2021 1145   MONOABS 0.8 05/31/2021 1145   EOSABS 0.2 05/31/2021 1145   BASOSABS 0.0 05/31/2021 1145    No results found for: "POCLITH", "LITHIUM"   No results found for: "PHENYTOIN", "PHENOBARB", "VALPROATE", "CBMZ"   .res Assessment: Plan:    Plan:  PDMP reviewed  Zoloft 150mg  daily Adderall XR 20mg  BID daily   Monitor BP between visits while taking stimulant medication.    [facility] ADD screening - 47/58 ADHD likely  RTC 3 months  Patient advised to contact office with any questions, adverse effects, or acute worsening in signs and symptoms.  Discussed potential benefits, risks, and side effects of stimulants with patient to include increased heart rate, palpitations, insomnia, increased  anxiety, increased irritability, or decreased appetite.  Instructed patient to contact office if experiencing any significant tolerability issues.  There are no diagnoses linked to this encounter.   Please see After Visit Summary for patient specific instructions.  Future Appointments  Date Time Provider Department Center  08/25/2023  8:00 AM Jannifer Fischler, Thereasa Solo, NP [facility]-CP None    No orders of the defined types were placed in this encounter.   -------------------------------

## 2023-10-25 ENCOUNTER — Ambulatory Visit (INDEPENDENT_AMBULATORY_CARE_PROVIDER_SITE_OTHER): Payer: Medicaid Other | Admitting: Adult Health

## 2023-10-25 ENCOUNTER — Encounter: Payer: Self-pay | Admitting: Adult Health

## 2023-10-25 DIAGNOSIS — G4700 Insomnia, unspecified: Secondary | ICD-10-CM

## 2023-10-25 DIAGNOSIS — F902 Attention-deficit hyperactivity disorder, combined type: Secondary | ICD-10-CM | POA: Diagnosis not present

## 2023-10-25 DIAGNOSIS — F411 Generalized anxiety disorder: Secondary | ICD-10-CM

## 2023-10-25 DIAGNOSIS — F410 Panic disorder [episodic paroxysmal anxiety] without agoraphobia: Secondary | ICD-10-CM

## 2023-10-25 DIAGNOSIS — F428 Other obsessive-compulsive disorder: Secondary | ICD-10-CM

## 2023-10-25 MED ORDER — AMPHETAMINE-DEXTROAMPHET ER 20 MG PO CP24
20.0000 mg | ORAL_CAPSULE | Freq: Two times a day (BID) | ORAL | 0 refills | Status: DC
Start: 2023-12-20 — End: 2024-01-17

## 2023-10-25 MED ORDER — AMPHETAMINE-DEXTROAMPHET ER 20 MG PO CP24
20.0000 mg | ORAL_CAPSULE | Freq: Two times a day (BID) | ORAL | 0 refills | Status: DC
Start: 2023-10-25 — End: 2024-01-17

## 2023-10-25 MED ORDER — AMPHETAMINE-DEXTROAMPHET ER 20 MG PO CP24
20.0000 mg | ORAL_CAPSULE | Freq: Two times a day (BID) | ORAL | 0 refills | Status: DC
Start: 2023-11-22 — End: 2024-01-17

## 2023-10-25 NOTE — Progress Notes (Signed)
Kayla Butler [id number] 11-Nov-1985 37 y.o.  Subjective:   Patient ID:  Kayla Butler is a 38 y.o. (DOB November 24, 1985) female.  Chief Complaint: No chief complaint on file.   HPI Kayla Butler presents to the office today for follow-up of ADHD, MDD, GAD and obsessional thoughts and acts.   Describes mood today as "ok". Pleasant. Denies tearfulness. Mood symptoms - denies depression, anxiety and irritability. Denies panic attacks. Denies obsessive and irrational thoughts. Denies worry, rumination, and over thinking. Reports financial stressors. Reports new job going well - staff supportive. Mood is stable. Stating "I feel like I'm doing alright". Feels like medications are helpful. Stable interest and motivation. Taking medications as prescribed.  Energy levels improved. Active, has a regular exercise routine.  Enjoys some usual interests and activities. Single. Dating. Lives alone with 2 cats "[name]". Mother and sister local. Brother in [place]. Spending time with family. Appetite adequate. Weight loss.  Sleeping better some nights than others. Averages 8 hours - unable to use CPAP machine. Focus and concentration stable. Managing aspects of household. 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  Denies AH or VH. Denies self harm. Denies substance use. Denies alcohol use.   Previous medication trials:  Zoloft,   Flowsheet Row ED from 03/18/2023 in [facility] Emergency Department at [facility] ED from 07/27/2021 in [facility] Emergency Department at [facility] ED from 05/31/2021 in [facility] Emergency Department at [facility]  C-SSRS RISK CATEGORY No Risk No Risk No Risk        Review of Systems:  Review of Systems  Musculoskeletal:  Negative for gait problem.  Neurological:  Negative for tremors.  Psychiatric/Behavioral:         Please refer to HPI    Medications: I have reviewed the patient's current medications.  Current  Outpatient Medications  Medication Sig Dispense Refill   amphetamine-dextroamphetamine (ADDERALL XR) 20 MG 24 hr capsule Take 1 capsule (20 mg total) by mouth 2 (two) times daily. 60 capsule 0   amphetamine-dextroamphetamine (ADDERALL XR) 20 MG 24 hr capsule Take 1 capsule (20 mg total) by mouth 2 (two) times daily. 60 capsule 0   amphetamine-dextroamphetamine (ADDERALL XR) 20 MG 24 hr capsule Take 1 capsule (20 mg total) by mouth 2 (two) times daily. 60 capsule 0   amphetamine-dextroamphetamine (ADDERALL) 20 MG tablet Take 1 tablet (20 mg total) by mouth daily. 60 tablet 0   sertraline (ZOLOFT) 100 MG tablet Take one and 1/2 tablets daily. 135 tablet 1   terbinafine (LAMISIL) 250 MG tablet Take 1 tablet (250 mg total) by mouth daily. 90 tablet 0   No current facility-administered medications for this visit.    Medication Side Effects: None  Allergies: No Known Allergies  Past Medical History:  Diagnosis Date   Anxiety    Thrombosis     Past Medical History, Surgical history, Social history, and Family history were reviewed and updated as appropriate.   Please see review of systems for further details on the patient's review from today.   Objective:   Physical Exam:  There were no vitals taken for this visit.  Physical Exam Constitutional:      General: She is not in acute distress. Musculoskeletal:        General: No deformity.  Neurological:     Mental Status: She is alert and oriented to person, place, and time.     Coordination: Coordination normal.  Psychiatric:  Attention and Perception: Attention and perception normal. She does not perceive auditory or visual hallucinations.        Mood and Affect: Affect is not labile, blunt, angry or inappropriate.        Speech: Speech normal.        Behavior: Behavior normal.        Thought Content: Thought content normal. Thought content is not paranoid or delusional. Thought content does not include homicidal or suicidal  ideation. Thought content does not include homicidal or suicidal plan.        Cognition and Memory: Cognition and memory normal.        Judgment: Judgment normal.     Comments: Insight intact     Lab Review:     Component Value Date/Time   NA 142 05/31/2021 1145   K 4.4 05/31/2021 1145   CL 112 (H) 05/31/2021 1145   CO2 23 05/31/2021 1145   GLUCOSE 90 05/31/2021 1145   BUN 13 05/31/2021 1145   CREATININE 0.72 05/31/2021 1145   CALCIUM 9.3 05/31/2021 1145   PROT 6.9 06/03/2022 1130   PROT 6.2 07/19/2020 0942   ALBUMIN 3.8 07/19/2020 0942   AST 12 06/03/2022 1130   ALT 11 06/03/2022 1130   ALKPHOS 49 07/19/2020 0942   BILITOT 0.7 06/03/2022 1130   BILITOT <0.2 07/19/2020 0942   GFRNONAA >60 05/31/2021 1145       Component Value Date/Time   WBC 10.2 05/31/2021 1145   RBC 4.75 05/31/2021 1145   HGB 14.5 05/31/2021 1145   HCT 43.8 05/31/2021 1145   PLT 314 05/31/2021 1145   MCV 92.2 05/31/2021 1145   MCH 30.5 05/31/2021 1145   MCHC 33.1 05/31/2021 1145   RDW 13.3 05/31/2021 1145   LYMPHSABS 1.9 05/31/2021 1145   MONOABS 0.8 05/31/2021 1145   EOSABS 0.2 05/31/2021 1145   BASOSABS 0.0 05/31/2021 1145    No results found for: "POCLITH", "LITHIUM"   No results found for: "PHENYTOIN", "PHENOBARB", "VALPROATE", "CBMZ"   .res Assessment: Plan:    Plan:  PDMP reviewed  Zoloft 150mg  daily Adderall XR 20mg  BID daily   Monitor BP between visits while taking stimulant medication.    [facility] ADD screening - 47/58 ADHD likely  RTC 3 months  Patient may need a letter for court regarding her period of disability.   Patient advised to contact office with any questions, adverse effects, or acute worsening in signs and symptoms.  Discussed potential benefits, risks, and side effects of stimulants with patient to include increased heart rate, palpitations, insomnia, increased anxiety, increased irritability, or decreased appetite.  Instructed patient to contact  office if experiencing any significant tolerability issues.   There are no diagnoses linked to this encounter.   Please see After Visit Summary for patient specific instructions.  Future Appointments  Date Time Provider Department Center  10/25/2023  1:30 PM Kaelum Kissick, Thereasa Solo, NP CP-CP None  12/06/2023  8:00 AM Siegfried Vieth, Thereasa Solo, NP CP-CP None    No orders of the defined types were placed in this encounter.   -------------------------------

## 2023-12-06 ENCOUNTER — Ambulatory Visit: Payer: BLUE CROSS/BLUE SHIELD | Admitting: Adult Health

## 2023-12-13 ENCOUNTER — Telehealth (INDEPENDENT_AMBULATORY_CARE_PROVIDER_SITE_OTHER): Admitting: Adult Health

## 2023-12-13 ENCOUNTER — Encounter: Payer: Self-pay | Admitting: Adult Health

## 2023-12-13 DIAGNOSIS — F428 Other obsessive-compulsive disorder: Secondary | ICD-10-CM | POA: Diagnosis not present

## 2023-12-13 DIAGNOSIS — F410 Panic disorder [episodic paroxysmal anxiety] without agoraphobia: Secondary | ICD-10-CM | POA: Diagnosis not present

## 2023-12-13 DIAGNOSIS — F902 Attention-deficit hyperactivity disorder, combined type: Secondary | ICD-10-CM

## 2023-12-13 DIAGNOSIS — F411 Generalized anxiety disorder: Secondary | ICD-10-CM | POA: Diagnosis not present

## 2023-12-13 MED ORDER — AMPHETAMINE-DEXTROAMPHETAMINE 10 MG PO TABS
10.0000 mg | ORAL_TABLET | Freq: Every day | ORAL | 0 refills | Status: DC
Start: 2023-12-13 — End: 2024-01-17

## 2023-12-13 NOTE — Progress Notes (Signed)
 Kayla Butler [id number] Oct 08, 1985 37 y.o.  Virtual Visit via Video Note  I connected with pt @ on 12/13/23 at  8:00 AM EDT by a video enabled telemedicine application and verified that I am speaking with the correct person using two identifiers.   I discussed the limitations of evaluation and management by telemedicine and the availability of in person appointments. The patient expressed understanding and agreed to proceed.  I discussed the assessment and treatment plan with the patient. The patient was provided an opportunity to ask questions and all were answered. The patient agreed with the plan and demonstrated an understanding of the instructions.   The patient was advised to call back or seek an in-person evaluation if the symptoms worsen or if the condition fails to improve as anticipated.  I provided 25 minutes of non-face-to-face time during this encounter.  The patient was located at home.  The provider was located at [facility].   Dorothyann Gibbs, NP   Subjective:   Patient ID:  Kayla Butler is a 38 y.o. (DOB [birth year]) female.  Chief Complaint: No chief complaint on file.   HPI Kayla Butler presents for follow-up of ADHD, MDD, GAD and obsessional thoughts and acts.   Describes mood today as "ok". Pleasant. Denies tearfulness. Mood symptoms - denies depression, anxiety and irritability. Denies panic attacks. Denies obsessive and irrational thoughts. Denies worry, rumination, and over thinking. Reports financial stressors. Reports new job going well - staff supportive. Mood is stable. Stating "I feel like I'm doing good". Stable interest and motivation. Taking medications as prescribed.  Energy levels improved. Active, has a regular exercise routine - walking an hour or more - doing yard work.  Enjoys some usual interests and activities. Single. Dating. Lives alone with 2 men, 1 dog, 2 cats "[name]". Mother and sister local. Brother in  [place]. Spending time with family. Appetite adequate. Weight loss - 30 pounds over last several months - currently 228 pounds. Sleeping better some nights than others. Averages 8 hours - unable to use CPAP machine - needs new equipment. Reports focus and concentration stable, but medication does not last her throughout the day. Managing aspects of household. 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  Denies AH or VH. Denies self harm. Denies substance use. Denies alcohol use.   Previous medication trials:  Zoloft,    Review of Systems:  Review of Systems  Musculoskeletal:  Negative for gait problem.  Neurological:  Negative for tremors.  Psychiatric/Behavioral:         Please refer to HPI    Medications: I have reviewed the patient's current medications.  Current Outpatient Medications  Medication Sig Dispense Refill   amphetamine-dextroamphetamine (ADDERALL) 10 MG tablet Take 1 tablet (10 mg total) by mouth daily. 30 tablet 0   amphetamine-dextroamphetamine (ADDERALL XR) 20 MG 24 hr capsule Take 1 capsule (20 mg total) by mouth 2 (two) times daily. 60 capsule 0   amphetamine-dextroamphetamine (ADDERALL XR) 20 MG 24 hr capsule Take 1 capsule (20 mg total) by mouth 2 (two) times daily. 60 capsule 0   [START ON 12/20/2023] amphetamine-dextroamphetamine (ADDERALL XR) 20 MG 24 hr capsule Take 1 capsule (20 mg total) by mouth 2 (two) times daily. 60 capsule 0   sertraline (ZOLOFT) 100 MG tablet Take one and 1/2 tablets daily. 135 tablet 1   terbinafine (LAMISIL) 250 MG tablet Take 1 tablet (250 mg total) by mouth daily. 90 tablet 0  No current facility-administered medications for this visit.    Medication Side Effects: None  Allergies: No Known Allergies  Past Medical History:  Diagnosis Date   Anxiety    Thrombosis     No family history on file.  Social History   Socioeconomic History   Marital status: Single    Spouse name: Not on file   Number of children: Not  on file   Years of education: Not on file   Highest education level: Not on file  Occupational History   Not on file  Tobacco Use   Smoking status: Never   Smokeless tobacco: Never  Vaping Use   Vaping status: Every Day  Substance and Sexual Activity   Alcohol use: Never   Drug use: Never   Sexual activity: Never  Other Topics Concern   Not on file  Social History Narrative   ** Merged History Encounter **       Social Drivers of Corporate investment banker Strain: Not on file  Food Insecurity: Not on file  Transportation Needs: Not on file  Physical Activity: Not on file  Stress: Not on file  Social Connections: Not on file  Intimate Partner Violence: Not on file    Past Medical History, Surgical history, Social history, and Family history were reviewed and updated as appropriate.   Please see review of systems for further details on the patient's review from today.   Objective:   Physical Exam:  There were no vitals taken for this visit.  Physical Exam Constitutional:      General: She is not in acute distress. Musculoskeletal:        General: No deformity.  Neurological:     Mental Status: She is alert and oriented to person, place, and time.     Coordination: Coordination normal.  Psychiatric:        Attention and Perception: Attention and perception normal. She does not perceive auditory or visual hallucinations.        Mood and Affect: Affect is not labile, blunt, angry or inappropriate.        Speech: Speech normal.        Behavior: Behavior normal.        Thought Content: Thought content normal. Thought content is not paranoid or delusional. Thought content does not include homicidal or suicidal ideation. Thought content does not include homicidal or suicidal plan.        Cognition and Memory: Cognition and memory normal.        Judgment: Judgment normal.     Comments: Insight intact     Lab Review:     Component Value Date/Time   NA 142  05/31/2021 1145   K 4.4 05/31/2021 1145   CL 112 (H) 05/31/2021 1145   CO2 23 05/31/2021 1145   GLUCOSE 90 05/31/2021 1145   BUN 13 05/31/2021 1145   CREATININE 0.72 05/31/2021 1145   CALCIUM 9.3 05/31/2021 1145   PROT 6.9 06/03/2022 1130   PROT 6.2 07/19/2020 0942   ALBUMIN 3.8 07/19/2020 0942   AST 12 06/03/2022 1130   ALT 11 06/03/2022 1130   ALKPHOS 49 07/19/2020 0942   BILITOT 0.7 06/03/2022 1130   BILITOT <0.2 07/19/2020 0942   GFRNONAA >60 05/31/2021 1145       Component Value Date/Time   WBC 10.2 05/31/2021 1145   RBC 4.75 05/31/2021 1145   HGB 14.5 05/31/2021 1145   HCT 43.8 05/31/2021 1145   PLT 314 05/31/2021 1145  MCV 92.2 05/31/2021 1145   MCH 30.5 05/31/2021 1145   MCHC 33.1 05/31/2021 1145   RDW 13.3 05/31/2021 1145   LYMPHSABS 1.9 05/31/2021 1145   MONOABS 0.8 05/31/2021 1145   EOSABS 0.2 05/31/2021 1145   BASOSABS 0.0 05/31/2021 1145    No results found for: "POCLITH", "LITHIUM"   No results found for: "PHENYTOIN", "PHENOBARB", "VALPROATE", "CBMZ"   .res Assessment: Plan:    Plan:  PDMP reviewed  Zoloft 150mg  daily Adderall XR 20mg  BID daily   Add Adderall 10mg  IR to take as needed for varying schedule.  Monitor BP between visits while taking stimulant medication.    [facility] ADD screening - 47/58 ADHD likely  RTC 3 months  25 minutes spent dedicated to the care of this patient on the date of this encounter to include pre-visit review of records, ordering of medication, post visit documentation, and face-to-face time with the patient discussing ADHD, MDD, GAD and obsessional thoughts and acts. Discussed continuing current medication regimen.  Discussed potential benefits, risks, and side effects of stimulants with patient to include increased heart rate, palpitations, insomnia, increased anxiety, increased irritability, or decreased appetite.  Instructed patient to contact office if experiencing any significant tolerability issues.    Diagnoses and all orders for this visit:  Attention deficit hyperactivity disorder (ADHD), combined type -     amphetamine-dextroamphetamine (ADDERALL) 10 MG tablet; Take 1 tablet (10 mg total) by mouth daily.  Generalized anxiety disorder  Panic attacks  Obsessional thoughts     Please see After Visit Summary for patient specific instructions.  No future appointments.   No orders of the defined types were placed in this encounter.     -------------------------------

## 2024-01-17 ENCOUNTER — Encounter: Payer: Self-pay | Admitting: Adult Health

## 2024-01-17 ENCOUNTER — Telehealth (INDEPENDENT_AMBULATORY_CARE_PROVIDER_SITE_OTHER): Admitting: Adult Health

## 2024-01-17 DIAGNOSIS — F410 Panic disorder [episodic paroxysmal anxiety] without agoraphobia: Secondary | ICD-10-CM

## 2024-01-17 DIAGNOSIS — F902 Attention-deficit hyperactivity disorder, combined type: Secondary | ICD-10-CM

## 2024-01-17 DIAGNOSIS — F411 Generalized anxiety disorder: Secondary | ICD-10-CM

## 2024-01-17 DIAGNOSIS — F909 Attention-deficit hyperactivity disorder, unspecified type: Secondary | ICD-10-CM | POA: Diagnosis not present

## 2024-01-17 DIAGNOSIS — F331 Major depressive disorder, recurrent, moderate: Secondary | ICD-10-CM | POA: Diagnosis not present

## 2024-01-17 DIAGNOSIS — F428 Other obsessive-compulsive disorder: Secondary | ICD-10-CM | POA: Diagnosis not present

## 2024-01-17 MED ORDER — SERTRALINE HCL 100 MG PO TABS: ORAL_TABLET | ORAL | 1 refills | Status: DC

## 2024-01-17 MED ORDER — AMPHETAMINE-DEXTROAMPHETAMINE 10 MG PO TABS: 10.0000 mg | ORAL_TABLET | Freq: Every day | ORAL | 0 refills | Status: DC

## 2024-01-17 MED ORDER — AMPHETAMINE-DEXTROAMPHET ER 20 MG PO CP24: 20.0000 mg | ORAL_CAPSULE | Freq: Two times a day (BID) | ORAL | 0 refills | Status: DC

## 2024-01-17 MED ORDER — AMPHETAMINE-DEXTROAMPHET ER 20 MG PO CP24
20.0000 mg | ORAL_CAPSULE | Freq: Two times a day (BID) | ORAL | 0 refills | Status: DC
Start: 2024-03-13 — End: 2024-05-17

## 2024-01-17 NOTE — Progress Notes (Signed)
 Kayla Butler [id number] 09-16-1986 37 y.o.  Virtual Visit via Video Note  I connected with pt @ on 01/17/24 at  9:00 AM EDT by a video enabled telemedicine application and verified that I am speaking with the correct person using two identifiers.   I discussed the limitations of evaluation and management by telemedicine and the availability of in person appointments. The patient expressed understanding and agreed to proceed.  I discussed the assessment and treatment plan with the patient. The patient was provided an opportunity to ask questions and all were answered. The patient agreed with the plan and demonstrated an understanding of the instructions.   The patient was advised to call back or seek an in-person evaluation if the symptoms worsen or if the condition fails to improve as anticipated.  I provided 25 minutes of non-face-to-face time during this encounter.  The patient was located at home.  The provider was located at [facility].   Reagan Camera, NP   Subjective:   Patient ID:  Kayla Butler is a 38 y.o. (DOB 1986-07-31) female.  Chief Complaint: No chief complaint on file.   HPI Girard Lam presents for follow-up of ADHD, MDD, GAD and obsessional thoughts and acts.   Describes mood today as "ok". Pleasant. Denies tearfulness. Mood symptoms - denies depression, and irritability. Reports stable interest and motivation. Reports anxiety. Denies panic attacks. Denies obsessive and irrational thoughts. Reports worry, rumination, and over thinking. Reports ongoing financial and situational stressors. Mood is stable. Stating "I feel like I'm doing alright". Taking medications as prescribed.  Energy levels improved. Active, has a regular exercise routine - walking. Enjoys some usual interests and activities. Single. Dating. Lives alone with 2 men, 1 dog, 2 cats "[name]". Mother and sister local. Brother in [place] . Spending time with  family. Appetite adequate. Weight loss - currently 228 pounds. Sleeping better some nights than others. Averages 8 hours - unable to use CPAP machine. Reports focus and concentration stable. Reports completing tasks. Managing aspects of household. 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  Denies AH or VH. Denies self harm. Denies substance use. Denies alcohol use.   Previous medication trials:  Zoloft ,   Review of Systems:  Review of Systems  Musculoskeletal:  Negative for gait problem.  Neurological:  Negative for tremors.  Psychiatric/Behavioral:         Please refer to HPI   Medications: I have reviewed the patient's current medications.  Current Outpatient Medications  Medication Sig Dispense Refill   amphetamine -dextroamphetamine  (ADDERALL XR) 20 MG 24 hr capsule Take 1 capsule (20 mg total) by mouth 2 (two) times daily. 60 capsule 0   amphetamine -dextroamphetamine  (ADDERALL XR) 20 MG 24 hr capsule Take 1 capsule (20 mg total) by mouth 2 (two) times daily. 60 capsule 0   amphetamine -dextroamphetamine  (ADDERALL XR) 20 MG 24 hr capsule Take 1 capsule (20 mg total) by mouth 2 (two) times daily. 60 capsule 0   amphetamine -dextroamphetamine  (ADDERALL) 10 MG tablet Take 1 tablet (10 mg total) by mouth daily. 30 tablet 0   sertraline  (ZOLOFT ) 100 MG tablet Take one and 1/2 tablets daily. 135 tablet 1   terbinafine  (LAMISIL ) 250 MG tablet Take 1 tablet (250 mg total) by mouth daily. 90 tablet 0   No current facility-administered medications for this visit.    Medication Side Effects: None  Allergies: No Known Allergies  Past Medical History:  Diagnosis Date   Anxiety    Thrombosis  No family history on file.  Social History   Socioeconomic History   Marital status: Single    Spouse name: Not on file   Number of children: Not on file   Years of education: Not on file   Highest education level: Not on file  Occupational History   Not on file  Tobacco Use    Smoking status: Never   Smokeless tobacco: Never  Vaping Use   Vaping status: Every Day  Substance and Sexual Activity   Alcohol use: Never   Drug use: Never   Sexual activity: Never  Other Topics Concern   Not on file  Social History Narrative   ** Merged History Encounter **       Social Drivers of Corporate investment banker Strain: Not on file  Food Insecurity: Not on file  Transportation Needs: Not on file  Physical Activity: Not on file  Stress: Not on file  Social Connections: Not on file  Intimate Partner Violence: Not on file    Past Medical History, Surgical history, Social history, and Family history were reviewed and updated as appropriate.   Please see review of systems for further details on the patient's review from today.   Objective:   Physical Exam:  There were no vitals taken for this visit.  Physical Exam Constitutional:      General: She is not in acute distress. Musculoskeletal:        General: No deformity.  Neurological:     Mental Status: She is alert and oriented to person, place, and time.     Coordination: Coordination normal.  Psychiatric:        Attention and Perception: Attention and perception normal. She does not perceive auditory or visual hallucinations.        Mood and Affect: Mood normal. Mood is not anxious or depressed. Affect is not labile, blunt, angry or inappropriate.        Speech: Speech normal.        Behavior: Behavior normal.        Thought Content: Thought content normal. Thought content is not paranoid or delusional. Thought content does not include homicidal or suicidal ideation. Thought content does not include homicidal or suicidal plan.        Cognition and Memory: Cognition and memory normal.        Judgment: Judgment normal.     Comments: Insight intact     Lab Review:     Component Value Date/Time   NA 142 05/31/2021 1145   K 4.4 05/31/2021 1145   CL 112 (H) 05/31/2021 1145   CO2 23 05/31/2021 1145    GLUCOSE 90 05/31/2021 1145   BUN 13 05/31/2021 1145   CREATININE 0.72 05/31/2021 1145   CALCIUM 9.3 05/31/2021 1145   PROT 6.9 06/03/2022 1130   PROT 6.2 07/19/2020 0942   ALBUMIN 3.8 07/19/2020 0942   AST 12 06/03/2022 1130   ALT 11 06/03/2022 1130   ALKPHOS 49 07/19/2020 0942   BILITOT 0.7 06/03/2022 1130   BILITOT <0.2 07/19/2020 0942   GFRNONAA >60 05/31/2021 1145       Component Value Date/Time   WBC 10.2 05/31/2021 1145   RBC 4.75 05/31/2021 1145   HGB 14.5 05/31/2021 1145   HCT 43.8 05/31/2021 1145   PLT 314 05/31/2021 1145   MCV 92.2 05/31/2021 1145   MCH 30.5 05/31/2021 1145   MCHC 33.1 05/31/2021 1145   RDW 13.3 05/31/2021 1145   LYMPHSABS 1.9 05/31/2021  1145   MONOABS 0.8 05/31/2021 1145   EOSABS 0.2 05/31/2021 1145   BASOSABS 0.0 05/31/2021 1145    No results found for: "POCLITH", "LITHIUM"   No results found for: "PHENYTOIN", "PHENOBARB", "VALPROATE", "CBMZ"   .res Assessment: Plan:    Plan:  PDMP reviewed  Zoloft  150mg  daily Adderall XR 20mg  BID daily  Adderall 10mg  IR to take as needed for varying work schedule.  Monitor BP between visits while taking stimulant medication.    [facility] ADD screening - 47/58 ADHD likely  RTC 3 months  25 minutes spent dedicated to the care of this patient on the date of this encounter to include pre-visit review of records, ordering of medication, post visit documentation, and face-to-face time with the patient discussing ADHD, MDD, GAD and obsessional thoughts and acts. Discussed continuing current medication regimen.  Discussed potential benefits, risks, and side effects of stimulants with patient to include increased heart rate, palpitations, insomnia, increased anxiety, increased irritability, or decreased appetite.  Instructed patient to contact office if experiencing any significant tolerability issues.    There are no diagnoses linked to this encounter.   Please see After Visit Summary for patient  specific instructions.  Future Appointments  Date Time Provider Department Center  01/17/2024  9:00 AM Kayla Lefevre Nattalie, NP [facility] None    No orders of the defined types were placed in this encounter.     -------------------------------

## 2024-04-19 ENCOUNTER — Other Ambulatory Visit: Payer: Self-pay

## 2024-04-19 ENCOUNTER — Telehealth: Payer: Self-pay | Admitting: Adult Health

## 2024-04-19 DIAGNOSIS — F902 Attention-deficit hyperactivity disorder, combined type: Secondary | ICD-10-CM

## 2024-04-19 MED ORDER — AMPHETAMINE-DEXTROAMPHETAMINE 10 MG PO TABS: 10.0000 mg | ORAL_TABLET | Freq: Every day | ORAL | 0 refills | Status: DC

## 2024-04-19 NOTE — Telephone Encounter (Signed)
 Next visit is 05/17/24. Requesting refill on Adderall IR 10 mg called to:  [id number] [place], [place] - [address]   Phone: [phone]  Fax: [phone]

## 2024-04-19 NOTE — Telephone Encounter (Signed)
 Pended.

## 2024-05-17 ENCOUNTER — Telehealth: Admitting: Adult Health

## 2024-05-17 ENCOUNTER — Encounter: Payer: Self-pay | Admitting: Adult Health

## 2024-05-17 DIAGNOSIS — F428 Other obsessive-compulsive disorder: Secondary | ICD-10-CM

## 2024-05-17 DIAGNOSIS — F411 Generalized anxiety disorder: Secondary | ICD-10-CM

## 2024-05-17 DIAGNOSIS — F331 Major depressive disorder, recurrent, moderate: Secondary | ICD-10-CM | POA: Diagnosis not present

## 2024-05-17 DIAGNOSIS — F902 Attention-deficit hyperactivity disorder, combined type: Secondary | ICD-10-CM

## 2024-05-17 DIAGNOSIS — F909 Attention-deficit hyperactivity disorder, unspecified type: Secondary | ICD-10-CM

## 2024-05-17 MED ORDER — AMPHETAMINE-DEXTROAMPHETAMINE 10 MG PO TABS: 10.0000 mg | ORAL_TABLET | Freq: Every day | ORAL | 0 refills | Status: DC

## 2024-05-17 MED ORDER — AMPHETAMINE-DEXTROAMPHET ER 20 MG PO CP24: 20.0000 mg | ORAL_CAPSULE | Freq: Two times a day (BID) | ORAL | 0 refills | Status: DC

## 2024-05-17 NOTE — Progress Notes (Signed)
 Kayla Butler [id number] August 31, 1986 38 y.o.  Virtual Visit via Video Note  I connected with pt @ on 05/17/24 at  8:30 AM EDT by a video enabled telemedicine application and verified that I am speaking with the correct person using two identifiers.   I discussed the limitations of evaluation and management by telemedicine and the availability of in person appointments. The patient expressed understanding and agreed to proceed.  I discussed the assessment and treatment plan with the patient. The patient was provided an opportunity to ask questions and all were answered. The patient agreed with the plan and demonstrated an understanding of the instructions.   The patient was advised to call back or seek an in-person evaluation if the symptoms worsen or if the condition fails to improve as anticipated.  I provided 25 minutes of non-face-to-face time during this encounter.  The patient was located at home.  The provider was located at [facility].   Angeline LOISE Sayers, NP   Subjective:   Patient ID:  SALAYA HOLTROP is a 38 y.o. (DOB 05-20-1986) female.  Chief Complaint: No chief complaint on file.   HPI Zane LITTIE Kitty presents for follow-up of ADHD, MDD, GAD and obsessional thoughts and acts.   Describes mood today as ok. Pleasant. Denies tearfulness. Mood symptoms - denies depression, anxiety and irritability. Reports stable interest and motivation. Denies panic attacks. Denies obsessive and irrational thoughts. Reports worry, rumination and over thinking. Reports improved financial and situational stressors. Reports mood is stable. Stating I feel like I'm doing ok. Taking medications as prescribed.  Energy levels improved. Active, has a regular exercise routine - walking. Enjoys some usual interests and activities. Single. Has moved in with mother - Janalyn and Brinda. Sister local. Brother in [place] . Spending time with family. Appetite adequate. Weight loss -  currently 210 pounds. Sleeping better some nights than others. Averages 8 hours - unable to use CPAP machine. Reports focus and concentration difficulties. Plans to get tested for Autism. Reports completing tasks. Managing aspects of household. 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 Denies SI or HI.  Denies AH or VH. Denies self harm. Denies substance use. Denies alcohol use.   Previous medication trials:  Zoloft ,   Review of Systems:  Review of Systems  Musculoskeletal:  Negative for gait problem.  Neurological:  Negative for tremors.  Psychiatric/Behavioral:         Please refer to HPI    Medications: I have reviewed the patient's current medications.  Current Outpatient Medications  Medication Sig Dispense Refill   amphetamine -dextroamphetamine  (ADDERALL XR) 20 MG 24 hr capsule Take 1 capsule (20 mg total) by mouth 2 (two) times daily. 60 capsule 0   amphetamine -dextroamphetamine  (ADDERALL XR) 20 MG 24 hr capsule Take 1 capsule (20 mg total) by mouth 2 (two) times daily. 60 capsule 0   amphetamine -dextroamphetamine  (ADDERALL XR) 20 MG 24 hr capsule Take 1 capsule (20 mg total) by mouth 2 (two) times daily. 60 capsule 0   amphetamine -dextroamphetamine  (ADDERALL) 10 MG tablet Take 1 tablet (10 mg total) by mouth daily. 30 tablet 0   sertraline  (ZOLOFT ) 100 MG tablet Take one and 1/2 tablets daily. 135 tablet 1   terbinafine  (LAMISIL ) 250 MG tablet Take 1 tablet (250 mg total) by mouth daily. 90 tablet 0   No current facility-administered medications for this visit.    Medication Side Effects: None  Allergies: No Known Allergies  Past Medical History:  Diagnosis Date   Anxiety    Thrombosis  No family history on file.  Social History   Socioeconomic History   Marital status: Single    Spouse name: Not on file   Number of children: Not on file   Years of education: Not on file   Highest education level: Not on file  Occupational History   Not on file  Tobacco Use    Smoking status: Never   Smokeless tobacco: Never  Vaping Use   Vaping status: Every Day  Substance and Sexual Activity   Alcohol use: Never   Drug use: Never   Sexual activity: Never  Other Topics Concern   Not on file  Social History Narrative   ** Merged History Encounter **       Social Drivers of Health   Financial Resource Strain: High Risk (02/07/2024)   Received from [facility]   Overall Financial Resource Strain (CARDIA)    Difficulty of Paying Living Expenses: Very hard  Food Insecurity: Food Insecurity Present (02/07/2024)   Received from [facility]   Hunger Vital Sign    Within the past 12 months, you worried that your food would run out before you got the money to buy more.: Sometimes true    Within the past 12 months, the food you bought just didn't last and you didn't have money to get more.: Sometimes true  Transportation Needs: No Transportation Needs (02/07/2024)   Received from [facility] - Transportation    Lack of Transportation (Medical): No    Lack of Transportation (Non-Medical): No  Physical Activity: Sufficiently Active (02/07/2024)   Received from [facility]   Exercise Vital Sign    On average, how many days per week do you engage in moderate to strenuous exercise (like a brisk walk)?: 5 days    On average, how many minutes do you engage in exercise at this level?: 60 min  Stress: No Stress Concern Present (02/07/2024)   Received from [facility] - Occupational Stress Questionnaire    Feeling of Stress : Not at all  Social Connections: Somewhat Isolated (02/07/2024)   Received from [facility]   Social Network    How would you rate your social network (family, work, friends)?: Restricted participation with some degree of social isolation  Intimate Partner Violence: Not At Risk (02/07/2024)   Received from [facility]   HITS    Over the last 12 months how often did your partner  physically hurt you?: Never    Over the last 12 months how often did your partner insult you or talk down to you?: Never    Over the last 12 months how often did your partner threaten you with physical harm?: Never    Over the last 12 months how often did your partner scream or curse at you?: Never    Past Medical History, Surgical history, Social history, and Family history were reviewed and updated as appropriate.   Please see review of systems for further details on the patient's review from today.   Objective:   Physical Exam:  There were no vitals taken for this visit.  Physical Exam Constitutional:      General: She is not in acute distress. Musculoskeletal:        General: No deformity.  Neurological:     Mental Status: She is alert and oriented to person, place, and time.     Coordination: Coordination normal.  Psychiatric:  Attention and Perception: Attention and perception normal. She does not perceive auditory or visual hallucinations.        Mood and Affect: Mood normal. Mood is not anxious or depressed. Affect is not labile, blunt, angry or inappropriate.        Speech: Speech normal.        Behavior: Behavior normal.        Thought Content: Thought content normal. Thought content is not paranoid or delusional. Thought content does not include homicidal or suicidal ideation. Thought content does not include homicidal or suicidal plan.        Cognition and Memory: Cognition and memory normal.        Judgment: Judgment normal.     Comments: Insight intact     Lab Review:     Component Value Date/Time   NA 142 05/31/2021 1145   K 4.4 05/31/2021 1145   CL 112 (H) 05/31/2021 1145   CO2 23 05/31/2021 1145   GLUCOSE 90 05/31/2021 1145   BUN 13 05/31/2021 1145   CREATININE 0.72 05/31/2021 1145   CALCIUM 9.3 05/31/2021 1145   PROT 6.9 06/03/2022 1130   PROT 6.2 07/19/2020 0942   ALBUMIN 3.8 07/19/2020 0942   AST 12 06/03/2022 1130   ALT 11 06/03/2022 1130    ALKPHOS 49 07/19/2020 0942   BILITOT 0.7 06/03/2022 1130   BILITOT <0.2 07/19/2020 0942   GFRNONAA >60 05/31/2021 1145       Component Value Date/Time   WBC 10.2 05/31/2021 1145   RBC 4.75 05/31/2021 1145   HGB 14.5 05/31/2021 1145   HCT 43.8 05/31/2021 1145   PLT 314 05/31/2021 1145   MCV 92.2 05/31/2021 1145   MCH 30.5 05/31/2021 1145   MCHC 33.1 05/31/2021 1145   RDW 13.3 05/31/2021 1145   LYMPHSABS 1.9 05/31/2021 1145   MONOABS 0.8 05/31/2021 1145   EOSABS 0.2 05/31/2021 1145   BASOSABS 0.0 05/31/2021 1145    No results found for: POCLITH, LITHIUM   No results found for: PHENYTOIN, PHENOBARB, VALPROATE, CBMZ   .res Assessment: Plan:    Plan:  PDMP reviewed  Zoloft  150mg  daily Adderall XR 20mg  BID daily  Adderall 10mg  IR to take as needed for varying work schedule.  Monitor BP between visits while taking stimulant medication.    [facility] ADD screening - 47/58 ADHD likely. Meets DSM criteria for ADD diagnosis.   RTC 3 months  25 minutes spent dedicated to the care of this patient on the date of this encounter to include pre-visit review of records, ordering of medication, post visit documentation, and face-to-face time with the patient discussing ADHD, MDD, GAD and obsessional thoughts and acts. Discussed continuing current medication regimen.  Discussed potential benefits, risks, and side effects of stimulants with patient to include increased heart rate, palpitations, insomnia, increased anxiety, increased irritability, or decreased appetite.  Instructed patient to contact office if experiencing any significant tolerability issues.   There are no diagnoses linked to this encounter.   Please see After Visit Summary for patient specific instructions.  Future Appointments  Date Time Provider Department Center  05/17/2024  8:30 AM Verlin Uher Nattalie, NP [facility] None    No orders of the defined types were placed in this encounter.      -------------------------------

## 2024-06-21 ENCOUNTER — Other Ambulatory Visit: Payer: Self-pay

## 2024-06-21 DIAGNOSIS — F902 Attention-deficit hyperactivity disorder, combined type: Secondary | ICD-10-CM

## 2024-06-22 MED ORDER — AMPHETAMINE-DEXTROAMPHETAMINE 10 MG PO TABS: 10.0000 mg | ORAL_TABLET | Freq: Every day | ORAL | 0 refills | Status: DC

## 2024-08-31 ENCOUNTER — Telehealth: Payer: Self-pay | Admitting: Adult Health

## 2024-08-31 DIAGNOSIS — F411 Generalized anxiety disorder: Secondary | ICD-10-CM

## 2024-08-31 DIAGNOSIS — F410 Panic disorder [episodic paroxysmal anxiety] without agoraphobia: Secondary | ICD-10-CM

## 2024-08-31 DIAGNOSIS — F428 Other obsessive-compulsive disorder: Secondary | ICD-10-CM

## 2024-08-31 NOTE — Telephone Encounter (Signed)
 Pt requesting Rx Adderall XR 20 mg & IR 10 mg Sertraline  [facility] [address]

## 2024-09-01 ENCOUNTER — Other Ambulatory Visit: Payer: Self-pay

## 2024-09-01 DIAGNOSIS — F902 Attention-deficit hyperactivity disorder, combined type: Secondary | ICD-10-CM

## 2024-09-01 MED ORDER — AMPHETAMINE-DEXTROAMPHETAMINE 10 MG PO TABS: 10.0000 mg | ORAL_TABLET | Freq: Every day | ORAL | 0 refills | Status: DC

## 2024-09-01 MED ORDER — SERTRALINE HCL 100 MG PO TABS: ORAL_TABLET | ORAL | 0 refills | Status: DC

## 2024-09-01 MED ORDER — AMPHETAMINE-DEXTROAMPHET ER 20 MG PO CP24: 20.0000 mg | ORAL_CAPSULE | Freq: Two times a day (BID) | ORAL | 0 refills | Status: DC

## 2024-09-01 NOTE — Telephone Encounter (Signed)
 Pended both doses of Adderall. Sent Sertraline .

## 2024-09-04 ENCOUNTER — Telehealth: Payer: Self-pay | Admitting: Adult Health

## 2024-09-04 NOTE — Telephone Encounter (Addendum)
 Pt lvm reporting Adderall  XR 20 mg out of stock. Requesting Rx for more 10 mg IR until 20 mg XR back in stock. Stated 10 mg only last 3 hours. RTC [phone] .

## 2024-09-04 NOTE — Telephone Encounter (Signed)
 Patient called back regarding prior message. She needs Adderall 20mg  canceled and resent to another pharmacy. PH: [phone] Appt 12/23 [facility] [address] [place]

## 2024-09-05 ENCOUNTER — Other Ambulatory Visit: Payer: Self-pay

## 2024-09-05 DIAGNOSIS — F902 Attention-deficit hyperactivity disorder, combined type: Secondary | ICD-10-CM

## 2024-09-05 MED ORDER — AMPHETAMINE-DEXTROAMPHET ER 20 MG PO CP24: 20.0000 mg | ORAL_CAPSULE | Freq: Two times a day (BID) | ORAL | 0 refills | Status: DC

## 2024-09-05 NOTE — Telephone Encounter (Signed)
 [facility]anceled [facility] [address] and repended [facility] [place].

## 2024-09-12 ENCOUNTER — Telehealth: Admitting: Adult Health

## 2024-09-12 ENCOUNTER — Encounter: Payer: Self-pay | Admitting: Adult Health

## 2024-09-12 DIAGNOSIS — F411 Generalized anxiety disorder: Secondary | ICD-10-CM

## 2024-09-12 DIAGNOSIS — F428 Other obsessive-compulsive disorder: Secondary | ICD-10-CM | POA: Diagnosis not present

## 2024-09-12 DIAGNOSIS — F410 Panic disorder [episodic paroxysmal anxiety] without agoraphobia: Secondary | ICD-10-CM | POA: Diagnosis not present

## 2024-09-12 DIAGNOSIS — F902 Attention-deficit hyperactivity disorder, combined type: Secondary | ICD-10-CM | POA: Diagnosis not present

## 2024-09-12 MED ORDER — AMPHETAMINE-DEXTROAMPHETAMINE 10 MG PO TABS: 10.0000 mg | ORAL_TABLET | Freq: Every day | ORAL | 0 refills | Status: AC

## 2024-09-12 MED ORDER — SERTRALINE HCL 100 MG PO TABS: ORAL_TABLET | ORAL | 2 refills | Status: AC

## 2024-09-12 MED ORDER — AMPHETAMINE-DEXTROAMPHET ER 20 MG PO CP24: 20.0000 mg | ORAL_CAPSULE | Freq: Two times a day (BID) | ORAL | 0 refills | Status: AC

## 2024-09-12 NOTE — Progress Notes (Signed)
 Kayla Butler [id number] 1986/07/24 38 y.o.  Virtual Visit via Video Note  I connected with pt @ on 09/12/2024 at  8:00 AM EST by a video enabled telemedicine application and verified that I am speaking with the correct person using two identifiers.   I discussed the limitations of evaluation and management by telemedicine and the availability of in person appointments. The patient expressed understanding and agreed to proceed.  I discussed the assessment and treatment plan with the patient. The patient was provided an opportunity to ask questions and all were answered. The patient agreed with the plan and demonstrated an understanding of the instructions.   The patient was advised to call back or seek an in-person evaluation if the symptoms worsen or if the condition fails to improve as anticipated.  I provided 25 minutes of non-face-to-face time during this encounter.  The patient was located at home.  The provider was located at [facility].   Angeline LOISE Sayers, NP   Subjective:   Patient ID:  Kayla Butler is a 38 y.o. (DOB Feb 24, 1986) female.  Chief Complaint: No chief complaint on file.   HPI Zane LITTIE Kitty presents for follow-up of ADHD, MDD, GAD and obsessional thoughts and acts.   Describes mood today as ok. Pleasant. Denies tearfulness. Mood symptoms - denies depression. Reports situational anxiety and irritability. Reports stable interest and motivation. Denies panic attacks. Denies obsessive and irrational thoughts. Reports some over thinking. Denies worry and rumination. Reports mood is stable. Stating I feel like I'm doing ok. Taking medications as prescribed.  Energy levels stable. Active, has a regular exercise routine - walking. Enjoys some usual interests and activities. Single. Has moved in with mother - Janalyn and Brinda. Reports a new relationship. Sister local. Brother in [place] . Spending time with family. Appetite adequate. Weight loss  - currently 210 pounds. Sleeping better some nights than others. Averages 8 hours . Reports focus and concentration difficulties - it's a little off. Reports completing tasks. Managing aspects of household. 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 Denies SI or HI.  Denies AH or VH. Denies self harm. Denies substance use. Denies alcohol use.  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  Previous medication trials:  Zoloft ,   Review of Systems:  Review of Systems  Musculoskeletal:  Negative for gait problem.  Neurological:  Negative for tremors.  Psychiatric/Behavioral:         Please refer to HPI    Medications: I have reviewed the patient's current medications.  Current Outpatient Medications  Medication Sig Dispense Refill   amphetamine -dextroamphetamine  (ADDERALL XR) 20 MG 24 hr capsule Take 1 capsule (20 mg total) by mouth 2 (two) times daily. 60 capsule 0   amphetamine -dextroamphetamine  (ADDERALL XR) 20 MG 24 hr capsule Take 1 capsule (20 mg total) by mouth 2 (two) times daily. 60 capsule 0   amphetamine -dextroamphetamine  (ADDERALL XR) 20 MG 24 hr capsule Take 1 capsule (20 mg total) by mouth 2 (two) times daily. 60 capsule 0   amphetamine -dextroamphetamine  (ADDERALL) 10 MG tablet Take 1 tablet (10 mg total) by mouth daily. 30 tablet 0   amphetamine -dextroamphetamine  (ADDERALL) 10 MG tablet Take 1 tablet (10 mg total) by mouth daily. 30 tablet 0   sertraline  (ZOLOFT ) 100 MG tablet Take one and 1/2 tablets daily. 45 tablet 0   terbinafine  (LAMISIL ) 250 MG tablet Take 1 tablet (250 mg total) by mouth daily. 90 tablet 0   No current facility-administered medications for this visit.  Medication Side Effects: None  Allergies: Allergies[1]  Past Medical History:  Diagnosis Date   Anxiety    Thrombosis     No family history on file.  Social History   Socioeconomic History   Marital status: Single    Spouse name: Not on file   Number of children: Not on file   Years of  education: Not on file   Highest education level: Not on file  Occupational History   Not on file  Tobacco Use   Smoking status: Never   Smokeless tobacco: Never  Vaping Use   Vaping status: Every Day  Substance and Sexual Activity   Alcohol use: Never   Drug use: Never   Sexual activity: Never  Other Topics Concern   Not on file  Social History Narrative   ** Merged History Encounter **       Social Drivers of Health   Tobacco Use: Low Risk (09/12/2024)   Patient History    Smoking Tobacco Use: Never    Smokeless Tobacco Use: Never    Passive Exposure: Not on file  Financial Resource Strain: High Risk (02/07/2024)   Received from [facility]   Overall Financial Resource Strain (CARDIA)    Difficulty of Paying Living Expenses: Very hard  Food Insecurity: Food Insecurity Present (02/07/2024)   Received from [facility]   Epic    Within the past 12 months, you worried that your food would run out before you got the money to buy more.: Sometimes true    Within the past 12 months, the food you bought just didn't last and you didn't have money to get more.: Sometimes true  Transportation Needs: No Transportation Needs (02/07/2024)   Received from [facility] - Transportation    Lack of Transportation (Medical): No    Lack of Transportation (Non-Medical): No  Physical Activity: Sufficiently Active (02/07/2024)   Received from [facility]   Exercise Vital Sign    On average, how many days per week do you engage in moderate to strenuous exercise (like a brisk walk)?: 5 days    On average, how many minutes do you engage in exercise at this level?: 60 min  Stress: No Stress Concern Present (02/07/2024)   Received from [facility] - Occupational Stress Questionnaire    Feeling of Stress : Not at all  Social Connections: Somewhat Isolated (02/07/2024)   Received from [facility]   Social Network    How would you rate  your social network (family, work, friends)?: Restricted participation with some degree of social isolation  Intimate Partner Violence: Not At Risk (02/07/2024)   Received from [facility]   HITS    Over the last 12 months how often did your partner physically hurt you?: Never    Over the last 12 months how often did your partner insult you or talk down to you?: Never    Over the last 12 months how often did your partner threaten you with physical harm?: Never    Over the last 12 months how often did your partner scream or curse at you?: Never  Depression (PHQ2-9): Not on file  Alcohol Screen: Not on file  Housing: High Risk (02/07/2024)   [facility] from [facility]    In the last 12 months, was there a time when you were not able to pay the mortgage or rent on time?: Yes    In the  past 12 months, how many times have you moved where you were living?: 3    At any time in the past 12 months, were you homeless or living in a shelter (including now)?: No  Utilities: At Risk (02/07/2024)   Received from [facility]ities    Threatened with loss of utilities: Yes  Health Literacy: Not on file    Past Medical History, Surgical history, Social history, and Family history were reviewed and updated as appropriate.   Please see review of systems for further details on the patient's review from today.   Objective:   Physical Exam:  There were no vitals taken for this visit.  Physical Exam Constitutional:      General: She is not in acute distress. Musculoskeletal:        General: No deformity.  Neurological:     Mental Status: She is alert and oriented to person, place, and time.     Coordination: Coordination normal.  Psychiatric:        Attention and Perception: Attention and perception normal. She does not perceive auditory or visual hallucinations.        Mood and Affect: Mood normal. Mood is not anxious or depressed. Affect is not labile, blunt, angry or  inappropriate.        Speech: Speech normal.        Behavior: Behavior normal.        Thought Content: Thought content normal. Thought content is not paranoid or delusional. Thought content does not include homicidal or suicidal ideation. Thought content does not include homicidal or suicidal plan.        Cognition and Memory: Cognition and memory normal.        Judgment: Judgment normal.     Comments: Insight intact     Lab Review:     Component Value Date/Time   NA 142 05/31/2021 1145   K 4.4 05/31/2021 1145   CL 112 (H) 05/31/2021 1145   CO2 23 05/31/2021 1145   GLUCOSE 90 05/31/2021 1145   BUN 13 05/31/2021 1145   CREATININE 0.72 05/31/2021 1145   CALCIUM 9.3 05/31/2021 1145   PROT 6.9 06/03/2022 1130   PROT 6.2 07/19/2020 0942   ALBUMIN 3.8 07/19/2020 0942   AST 12 06/03/2022 1130   ALT 11 06/03/2022 1130   ALKPHOS 49 07/19/2020 0942   BILITOT 0.7 06/03/2022 1130   BILITOT <0.2 07/19/2020 0942   GFRNONAA >60 05/31/2021 1145       Component Value Date/Time   WBC 10.2 05/31/2021 1145   RBC 4.75 05/31/2021 1145   HGB 14.5 05/31/2021 1145   HCT 43.8 05/31/2021 1145   PLT 314 05/31/2021 1145   MCV 92.2 05/31/2021 1145   MCH 30.5 05/31/2021 1145   MCHC 33.1 05/31/2021 1145   RDW 13.3 05/31/2021 1145   LYMPHSABS 1.9 05/31/2021 1145   MONOABS 0.8 05/31/2021 1145   EOSABS 0.2 05/31/2021 1145   BASOSABS 0.0 05/31/2021 1145    No results found for: POCLITH, LITHIUM   No results found for: PHENYTOIN, PHENOBARB, VALPROATE, CBMZ   .res Assessment: Plan:   Plan:  PDMP reviewed  Zoloft  150mg  daily Adderall XR 20mg  BID daily  Adderall 10mg  IR to take as needed for varying work schedule.  Monitor BP between visits while taking stimulant medication.    [facility] ADD screening - 47/58 ADHD likely. Meets DSM criteria for ADD diagnosis.   RTC 3 months  25 minutes spent dedicated to the  care of this patient on the date of this encounter to include  pre-visit review of records, ordering of medication, post visit documentation, and face-to-face time with the patient discussing ADHD, MDD, GAD and obsessional thoughts and acts. Discussed continuing current medication regimen.  Discussed potential benefits, risks, and side effects of stimulants with patient to include increased heart rate, palpitations, insomnia, increased anxiety, increased irritability, or decreased appetite.  Instructed patient to contact office if experiencing any significant tolerability issues.   Diagnoses and all orders for this visit:  Generalized anxiety disorder  Obsessional thoughts  Panic attacks  Attention deficit hyperactivity disorder (ADHD), combined type     Please see After Visit Summary for patient specific instructions.  Future Appointments  Date Time Provider Department Center  09/12/2024  8:00 AM Ariely Riddell Nattalie, NP [facility] None    No orders of the defined types were placed in this encounter.     -------------------------------     [1] No Known Allergies
